# Patient Record
Sex: Female | Born: 1990 | Race: White | Hispanic: No | Marital: Married | Smoking: Never smoker
Health system: Southern US, Community
[De-identification: ages and names within clinical notes are randomized; demographics above are authoritative.]

## PROBLEM LIST (undated history)

## (undated) DIAGNOSIS — F419 Anxiety disorder, unspecified: Secondary | ICD-10-CM

## (undated) DIAGNOSIS — K219 Gastro-esophageal reflux disease without esophagitis: Secondary | ICD-10-CM

## (undated) DIAGNOSIS — R011 Cardiac murmur, unspecified: Secondary | ICD-10-CM

## (undated) DIAGNOSIS — D649 Anemia, unspecified: Secondary | ICD-10-CM

## (undated) DIAGNOSIS — F411 Generalized anxiety disorder: Secondary | ICD-10-CM

## (undated) HISTORY — PX: WISDOM TOOTH EXTRACTION: SHX21

## (undated) HISTORY — DX: Anxiety disorder, unspecified: F41.9

---
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
## 2009-03-19 ENCOUNTER — Ambulatory Visit: Payer: Self-pay | Admitting: Gastroenterology

## 2009-03-19 DIAGNOSIS — R197 Diarrhea, unspecified: Secondary | ICD-10-CM | POA: Insufficient documentation

## 2009-03-19 DIAGNOSIS — K920 Hematemesis: Secondary | ICD-10-CM | POA: Insufficient documentation

## 2009-03-19 DIAGNOSIS — R12 Heartburn: Secondary | ICD-10-CM | POA: Insufficient documentation

## 2009-03-20 ENCOUNTER — Telehealth (INDEPENDENT_AMBULATORY_CARE_PROVIDER_SITE_OTHER): Payer: Self-pay | Admitting: *Deleted

## 2009-03-20 LAB — CONVERTED CEMR LAB
ALT: 16 units/L (ref 0–35)
AST: 18 units/L (ref 0–37)
Albumin: 4.5 g/dL (ref 3.5–5.2)
Alkaline Phosphatase: 74 units/L (ref 39–117)
BUN: 14 mg/dL (ref 6–23)
Basophils Absolute: 0 10*3/uL (ref 0.0–0.1)
Basophils Relative: 0.2 % (ref 0.0–3.0)
Calcium: 9.7 mg/dL (ref 8.4–10.5)
Chloride: 103 meq/L (ref 96–112)
Eosinophils Absolute: 0.1 10*3/uL (ref 0.0–0.7)
IgA: 229 mg/dL (ref 68–378)
Lymphocytes Relative: 18.6 % (ref 12.0–46.0)
MCHC: 35.1 g/dL (ref 30.0–36.0)
MCV: 95.7 fL (ref 78.0–100.0)
Monocytes Absolute: 0.4 10*3/uL (ref 0.1–1.0)
Neutrophils Relative %: 75.9 % (ref 43.0–77.0)
Platelets: 269 10*3/uL (ref 150.0–400.0)
Potassium: 4 meq/L (ref 3.5–5.1)
RDW: 12.6 % (ref 11.5–14.6)
TSH: 1.48 microintl units/mL (ref 0.35–5.50)

## 2009-03-22 ENCOUNTER — Encounter: Payer: Self-pay | Admitting: Gastroenterology

## 2009-03-22 ENCOUNTER — Ambulatory Visit: Payer: Self-pay | Admitting: Gastroenterology

## 2009-04-03 ENCOUNTER — Encounter: Payer: Self-pay | Admitting: Gastroenterology

## 2009-09-04 IMAGING — RF DG UGI W/ HIGH DENSITY W/KUB
18 of 20 series · 18 of 20 positions shown · non-contrast
Comparison: None.

CLINICAL DATA: Abdominal pain with nausea and vomiting.

UPPER GI SERIES W/HIGH DENSITY W/KUB
TECHNIQUE: After obtaining a scout radiograph, upper GI series
performed with high density barium and effervescent agent. Thin
barium also used. Low dose pulsed fluoroscopy with  last image hold
imaging was utilized. Pelvic shield utilized.

[Series 1: run · 1 of 1 slices shown (1 of 17)]
[im 1/1]
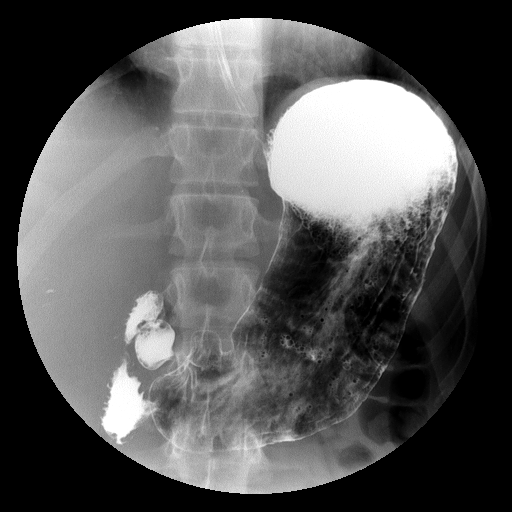

[Series 2: run · 1 of 1 slices shown (2 of 17)]
[im 1/1]
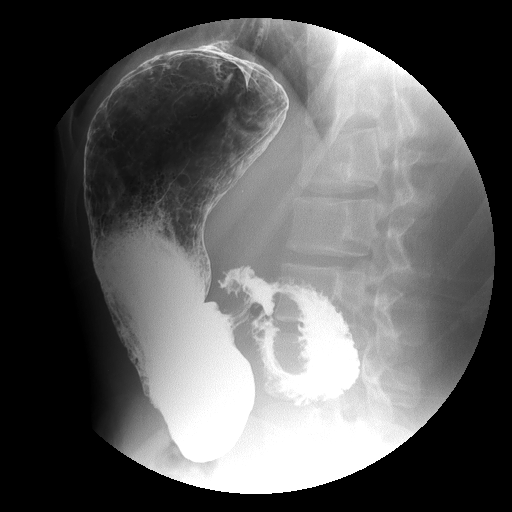

[Series 3: run · 1 of 1 slices shown (3 of 17)]
[im 1/1]
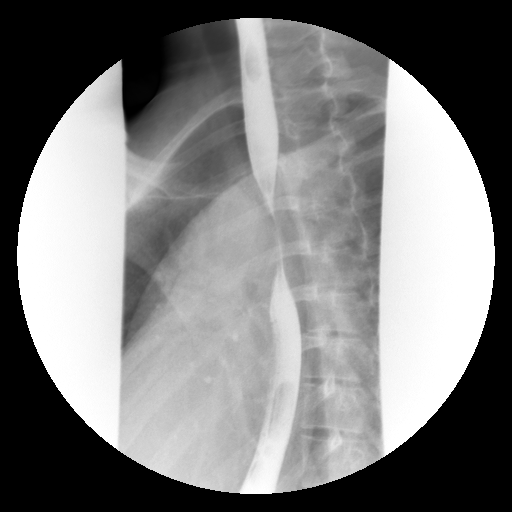

[Series 4: run · 1 of 1 slices shown (4 of 17)]
[im 1/1]
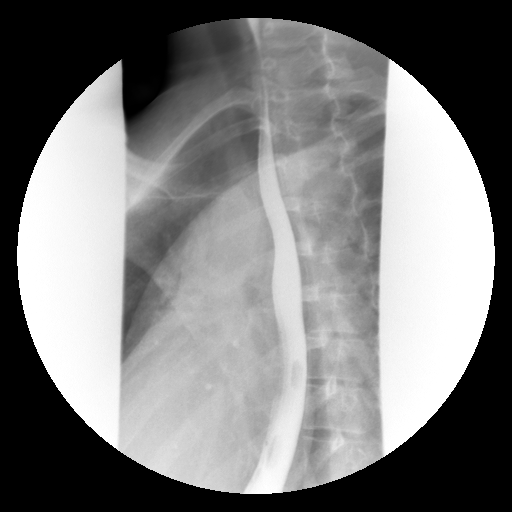

[Series 6: run · 1 of 1 slices shown (5 of 17)]
[im 1/1]
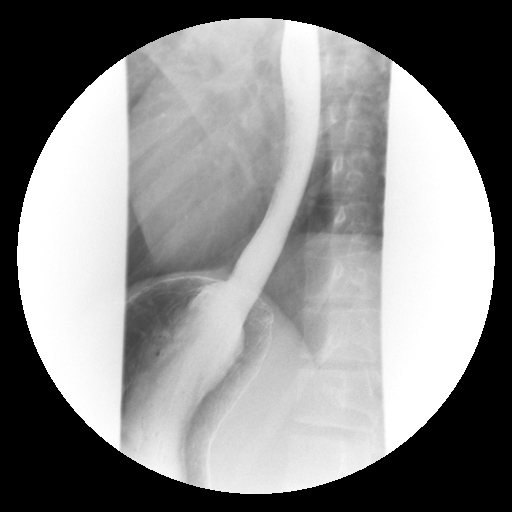

[Series 7: run · 1 of 1 slices shown (6 of 17)]
[im 1/1]
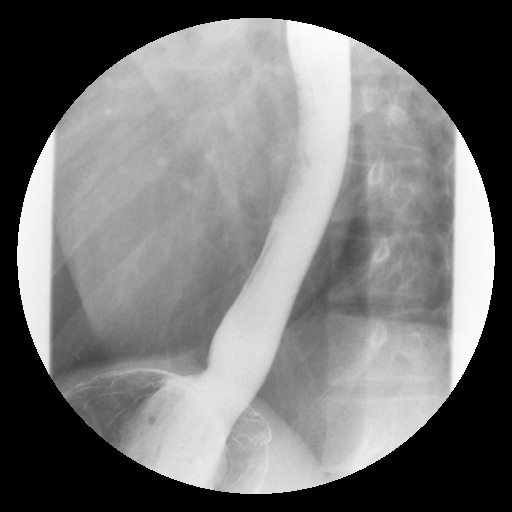

[Series 8: run · 1 of 1 slices shown (7 of 17)]
[im 1/1]
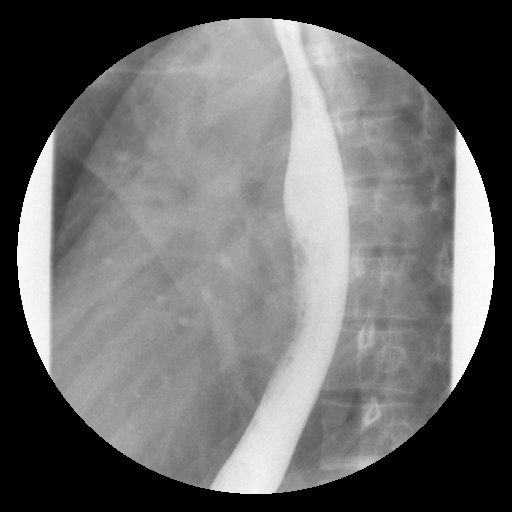

[Series 9: run · 1 of 1 slices shown (8 of 17)]
[im 1/1]
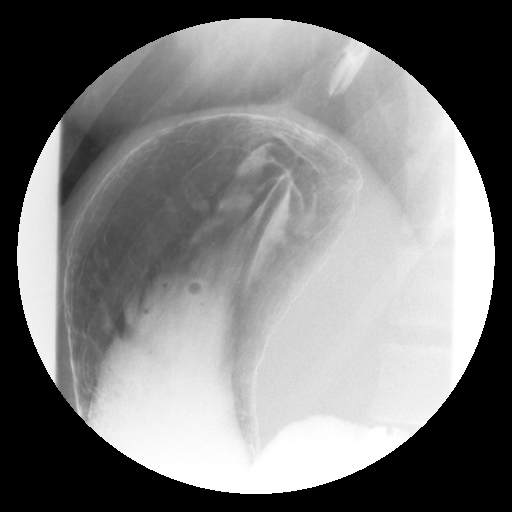

[Series 10: run · 1 of 1 slices shown (9 of 17)]
[im 1/1]
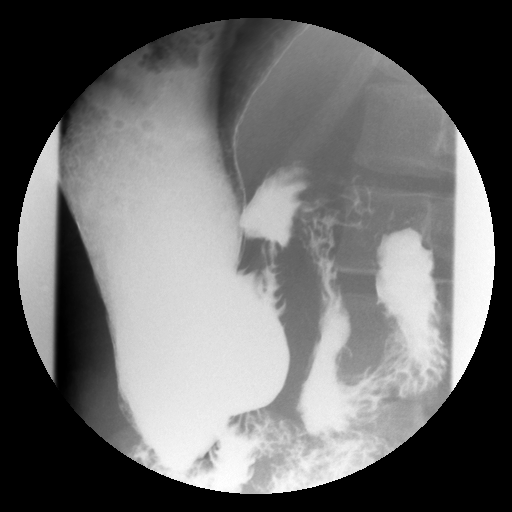

[Series 11: run · 1 of 1 slices shown (10 of 17)]
[im 1/1]
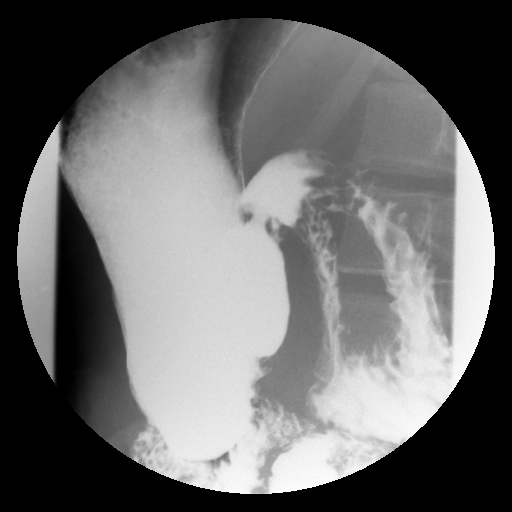

[Series 12: run · 1 of 1 slices shown (11 of 17)]
[im 1/1]
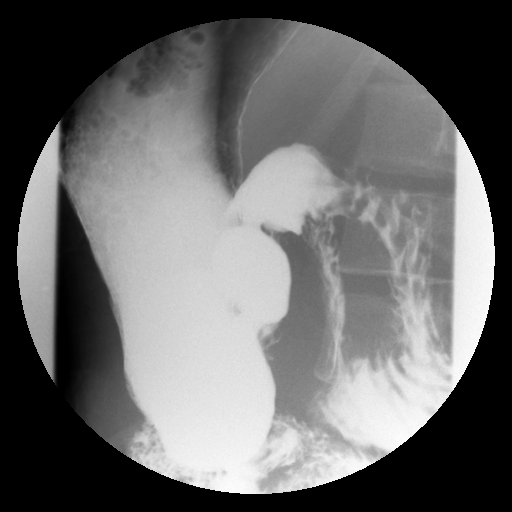

[Series 13: run · 1 of 1 slices shown (12 of 17)]
[im 1/1]
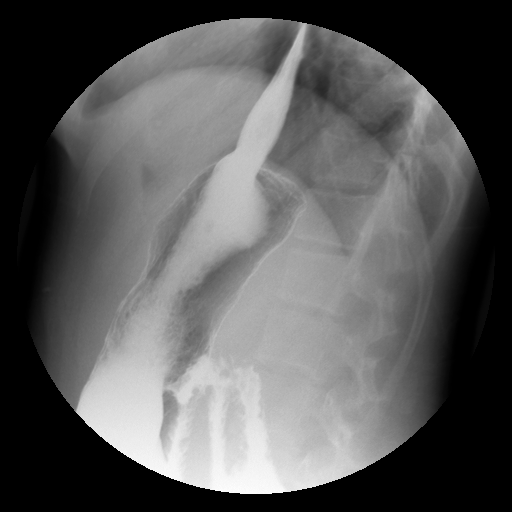

[Series 14: run · 1 of 1 slices shown (13 of 17)]
[im 1/1]
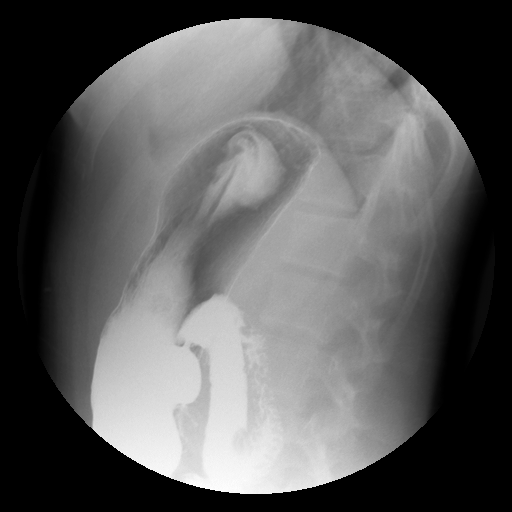

[Series 16: run · 1 of 1 slices shown (14 of 17)]
[im 1/1]
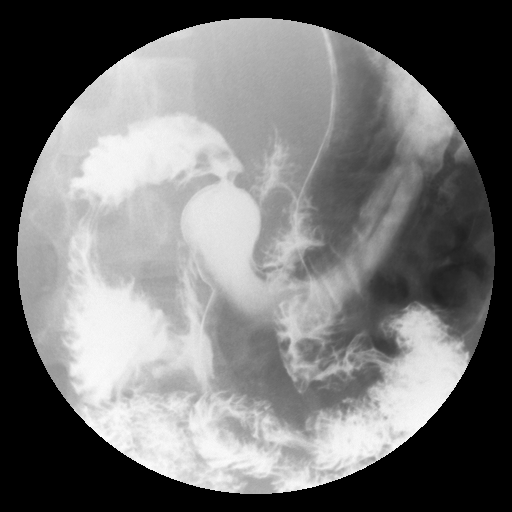

[Series 17: run · 1 of 1 slices shown (15 of 17)]
[im 1/1]
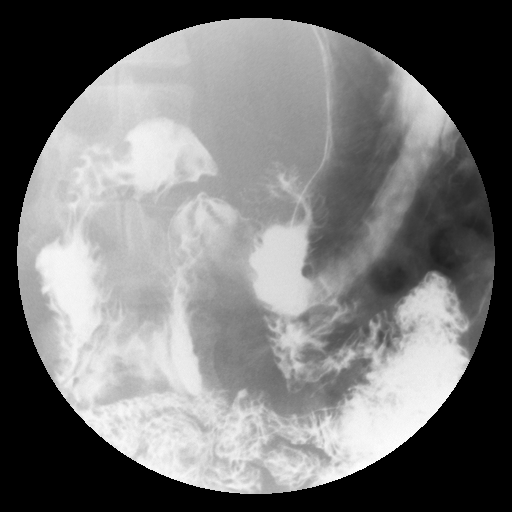

[Series 18: run · 1 of 1 slices shown (16 of 17)]
[im 1/1]
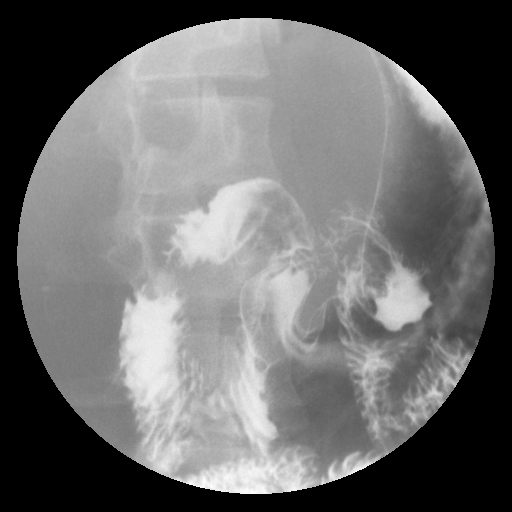

[Series 19: run · 1 of 1 slices shown (17 of 17)]
[im 1/1]
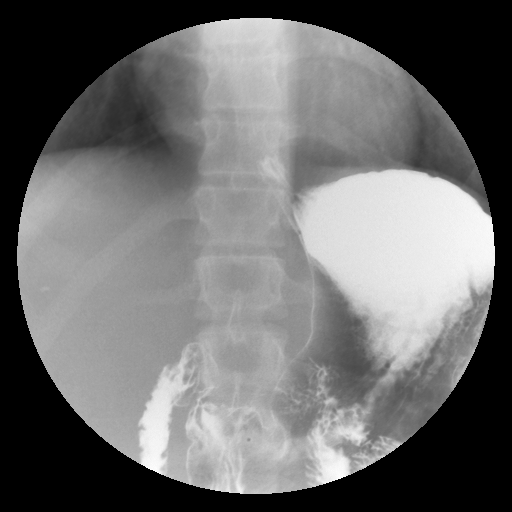

[Series 1001: view not recorded · 0.20mm/px · 1 of 1 slices shown]
[im 1/1]
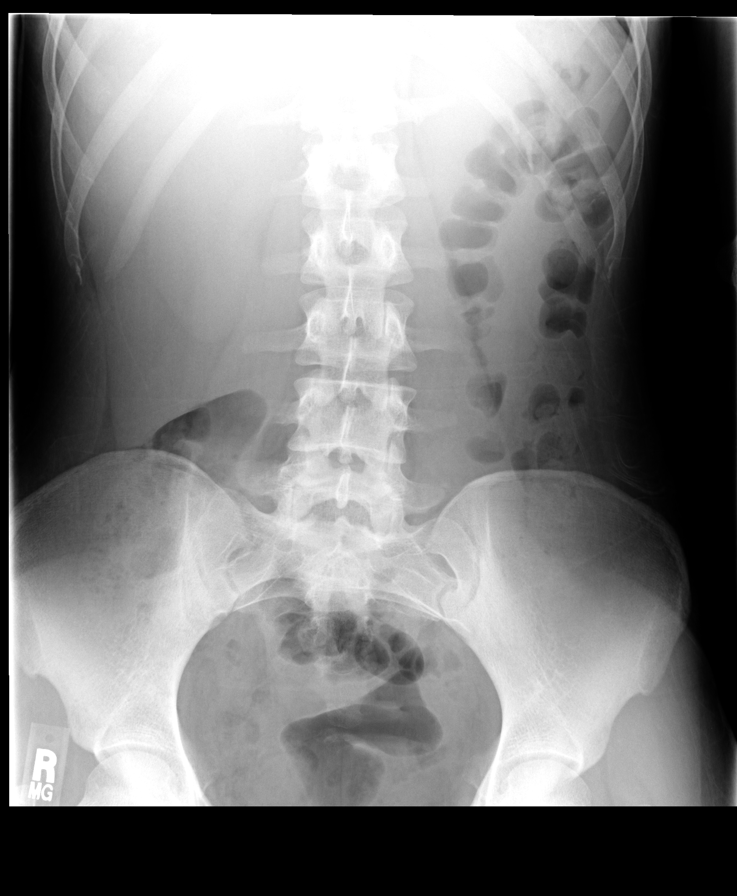

[18 of 20 positions shown; findings below may reference images not displayed]

FINDINGS: Scout view reveals a very minimal levoscoliosis of the
lumbar spine which may be positional.

Normal primary esophageal stripping wave without evidence of an
esophageal obstructing or constricting lesion.  Mild spontaneous
gastroesophageal reflux occurred into the distal thoracic esophagus
with change in patient position.  This immediately cleared.

Stomach is of normal contour without focal mucosal abnormality.
Duodenal bulb is of normal configuration without evidence of
ulceration.  Proximally visualized small bowel is unremarkable.
IMPRESSION: Mild spontaneous gastroesophageal reflux.  Otherwise negative exam.

## 2015-08-14 ENCOUNTER — Encounter: Payer: Self-pay | Admitting: Gastroenterology

## 2020-04-12 DIAGNOSIS — Z3201 Encounter for pregnancy test, result positive: Secondary | ICD-10-CM | POA: Diagnosis not present

## 2020-04-29 DIAGNOSIS — Z113 Encounter for screening for infections with a predominantly sexual mode of transmission: Secondary | ICD-10-CM | POA: Diagnosis not present

## 2020-04-29 DIAGNOSIS — Z01419 Encounter for gynecological examination (general) (routine) without abnormal findings: Secondary | ICD-10-CM | POA: Diagnosis not present

## 2020-04-29 DIAGNOSIS — Z124 Encounter for screening for malignant neoplasm of cervix: Secondary | ICD-10-CM | POA: Diagnosis not present

## 2020-04-29 DIAGNOSIS — Z1151 Encounter for screening for human papillomavirus (HPV): Secondary | ICD-10-CM | POA: Diagnosis not present

## 2020-04-29 DIAGNOSIS — B373 Candidiasis of vulva and vagina: Secondary | ICD-10-CM | POA: Diagnosis not present

## 2020-04-29 DIAGNOSIS — Z3A10 10 weeks gestation of pregnancy: Secondary | ICD-10-CM | POA: Diagnosis not present

## 2020-04-29 DIAGNOSIS — Z36 Encounter for antenatal screening for chromosomal anomalies: Secondary | ICD-10-CM | POA: Diagnosis not present

## 2020-04-29 DIAGNOSIS — O30049 Twin pregnancy, dichorionic/diamniotic, unspecified trimester: Secondary | ICD-10-CM | POA: Diagnosis not present

## 2020-05-06 ENCOUNTER — Inpatient Hospital Stay (HOSPITAL_COMMUNITY): Admit: 2020-05-06 | Payer: Self-pay | Admitting: Obstetrics and Gynecology

## 2020-05-27 DIAGNOSIS — Z3A14 14 weeks gestation of pregnancy: Secondary | ICD-10-CM | POA: Diagnosis not present

## 2020-05-27 DIAGNOSIS — O30041 Twin pregnancy, dichorionic/diamniotic, first trimester: Secondary | ICD-10-CM | POA: Diagnosis not present

## 2020-06-26 DIAGNOSIS — Z3A19 19 weeks gestation of pregnancy: Secondary | ICD-10-CM | POA: Diagnosis not present

## 2020-06-26 DIAGNOSIS — Z363 Encounter for antenatal screening for malformations: Secondary | ICD-10-CM | POA: Diagnosis not present

## 2020-06-26 DIAGNOSIS — O30042 Twin pregnancy, dichorionic/diamniotic, second trimester: Secondary | ICD-10-CM | POA: Diagnosis not present

## 2020-06-26 DIAGNOSIS — B373 Candidiasis of vulva and vagina: Secondary | ICD-10-CM | POA: Diagnosis not present

## 2020-06-26 DIAGNOSIS — Z361 Encounter for antenatal screening for raised alphafetoprotein level: Secondary | ICD-10-CM | POA: Diagnosis not present

## 2020-07-11 DIAGNOSIS — Z23 Encounter for immunization: Secondary | ICD-10-CM | POA: Diagnosis not present

## 2020-07-11 DIAGNOSIS — O30049 Twin pregnancy, dichorionic/diamniotic, unspecified trimester: Secondary | ICD-10-CM | POA: Diagnosis not present

## 2020-08-07 DIAGNOSIS — Z3689 Encounter for other specified antenatal screening: Secondary | ICD-10-CM | POA: Diagnosis not present

## 2020-08-28 DIAGNOSIS — Z3A28 28 weeks gestation of pregnancy: Secondary | ICD-10-CM | POA: Diagnosis not present

## 2020-08-28 DIAGNOSIS — Z23 Encounter for immunization: Secondary | ICD-10-CM | POA: Diagnosis not present

## 2020-08-28 DIAGNOSIS — Z3689 Encounter for other specified antenatal screening: Secondary | ICD-10-CM | POA: Diagnosis not present

## 2020-08-28 DIAGNOSIS — O30042 Twin pregnancy, dichorionic/diamniotic, second trimester: Secondary | ICD-10-CM | POA: Diagnosis not present

## 2020-09-27 DIAGNOSIS — O99013 Anemia complicating pregnancy, third trimester: Secondary | ICD-10-CM | POA: Diagnosis not present

## 2020-09-27 DIAGNOSIS — Z3A32 32 weeks gestation of pregnancy: Secondary | ICD-10-CM | POA: Diagnosis not present

## 2020-10-01 ENCOUNTER — Telehealth: Payer: Self-pay

## 2020-10-01 ENCOUNTER — Other Ambulatory Visit: Payer: Self-pay | Admitting: Obstetrics and Gynecology

## 2020-10-01 DIAGNOSIS — O30003 Twin pregnancy, unspecified number of placenta and unspecified number of amniotic sacs, third trimester: Secondary | ICD-10-CM

## 2020-10-01 NOTE — Telephone Encounter (Signed)
Received a referral for this patient to be scheduled STAT with our clinic. We had an opening on Friday and I went ahead and scheduled it for her. I left the patient a voicemail for her to call back and confirm.

## 2020-10-02 ENCOUNTER — Other Ambulatory Visit: Payer: Self-pay

## 2020-10-02 ENCOUNTER — Encounter (HOSPITAL_COMMUNITY): Payer: Self-pay | Admitting: Obstetrics and Gynecology

## 2020-10-02 ENCOUNTER — Observation Stay (HOSPITAL_COMMUNITY)
Admission: AD | Admit: 2020-10-02 | Discharge: 2020-10-04 | Disposition: A | Discharge status: Home / Self Care | Payer: BC Managed Care – PPO | Source: Home / Self Care | Attending: Obstetrics and Gynecology | Admitting: Obstetrics and Gynecology

## 2020-10-02 DIAGNOSIS — R030 Elevated blood-pressure reading, without diagnosis of hypertension: Secondary | ICD-10-CM | POA: Diagnosis not present

## 2020-10-02 DIAGNOSIS — M5489 Other dorsalgia: Secondary | ICD-10-CM | POA: Diagnosis not present

## 2020-10-02 DIAGNOSIS — O133 Gestational [pregnancy-induced] hypertension without significant proteinuria, third trimester: Secondary | ICD-10-CM | POA: Diagnosis not present

## 2020-10-02 DIAGNOSIS — O169 Unspecified maternal hypertension, unspecified trimester: Secondary | ICD-10-CM | POA: Diagnosis present

## 2020-10-02 DIAGNOSIS — O30043 Twin pregnancy, dichorionic/diamniotic, third trimester: Secondary | ICD-10-CM | POA: Diagnosis not present

## 2020-10-02 DIAGNOSIS — O163 Unspecified maternal hypertension, third trimester: Secondary | ICD-10-CM | POA: Diagnosis present

## 2020-10-02 DIAGNOSIS — O30009 Twin pregnancy, unspecified number of placenta and unspecified number of amniotic sacs, unspecified trimester: Secondary | ICD-10-CM | POA: Diagnosis present

## 2020-10-02 DIAGNOSIS — Z3A33 33 weeks gestation of pregnancy: Secondary | ICD-10-CM | POA: Diagnosis not present

## 2020-10-02 DIAGNOSIS — O365939 Maternal care for other known or suspected poor fetal growth, third trimester, other fetus: Secondary | ICD-10-CM | POA: Diagnosis present

## 2020-10-02 LAB — TYPE AND SCREEN
ABO/RH(D): B POS
Antibody Screen: NEGATIVE

## 2020-10-02 LAB — URINALYSIS, ROUTINE W REFLEX MICROSCOPIC
Bilirubin Urine: NEGATIVE
Glucose, UA: NEGATIVE mg/dL
Hgb urine dipstick: NEGATIVE
Ketones, ur: NEGATIVE mg/dL
Nitrite: NEGATIVE
Protein, ur: 100 mg/dL — AB
Specific Gravity, Urine: 1.018 (ref 1.005–1.030)
pH: 5 (ref 5.0–8.0)

## 2020-10-02 LAB — COMPREHENSIVE METABOLIC PANEL
ALT: 37 U/L (ref 0–44)
AST: 28 U/L (ref 15–41)
Albumin: 2.7 g/dL — ABNORMAL LOW (ref 3.5–5.0)
Alkaline Phosphatase: 162 U/L — ABNORMAL HIGH (ref 38–126)
Anion gap: 9 (ref 5–15)
BUN: 9 mg/dL (ref 6–20)
CO2: 21 mmol/L — ABNORMAL LOW (ref 22–32)
Calcium: 8.9 mg/dL (ref 8.9–10.3)
Chloride: 106 mmol/L (ref 98–111)
Creatinine, Ser: 0.71 mg/dL (ref 0.44–1.00)
GFR, Estimated: >60 mL/min (ref >60)
Glucose, Bld: 94 mg/dL (ref 70–99)
Potassium: 4.3 mmol/L (ref 3.5–5.1)
Sodium: 136 mmol/L (ref 135–145)
Total Bilirubin: 0.3 mg/dL (ref 0.3–1.2)
Total Protein: 6 g/dL — ABNORMAL LOW (ref 6.5–8.1)

## 2020-10-02 LAB — CBC
HCT: 30.8 % — ABNORMAL LOW (ref 36.0–46.0)
Hemoglobin: 10.5 g/dL — ABNORMAL LOW (ref 12.0–15.0)
MCH: 33.1 pg (ref 26.0–34.0)
MCHC: 34.1 g/dL (ref 30.0–36.0)
MCV: 97.2 fL (ref 80.0–100.0)
Platelets: 138 10*3/uL — ABNORMAL LOW (ref 150–400)
RBC: 3.17 MIL/uL — ABNORMAL LOW (ref 3.87–5.11)
RDW: 13.2 % (ref 11.5–15.5)
WBC: 7.1 10*3/uL (ref 4.0–10.5)
nRBC: 0 % (ref 0.0–0.2)

## 2020-10-02 LAB — RESP PANEL BY RT-PCR (FLU A&B, COVID) ARPGX2
Influenza A by PCR: NEGATIVE
Influenza B by PCR: NEGATIVE
SARS Coronavirus 2 by RT PCR: NEGATIVE

## 2020-10-02 LAB — PROTEIN / CREATININE RATIO, URINE
Creatinine, Urine: 254.34 mg/dL
Protein Creatinine Ratio: 0.27 mg/mg{Cre} — ABNORMAL HIGH (ref 0.00–0.15)
Total Protein, Urine: 69 mg/dL

## 2020-10-02 MED ORDER — ZOLPIDEM TARTRATE 5 MG PO TABS: 5.0000 mg | ORAL_TABLET | Freq: Every evening | ORAL | Status: DC | PRN

## 2020-10-02 MED ORDER — ACETAMINOPHEN 325 MG PO TABS
650.0000 mg | ORAL_TABLET | ORAL | Status: DC | PRN
  Administered 2020-10-02: 650 mg via ORAL
  Filled 2020-10-02: qty 2

## 2020-10-02 MED ORDER — DOCUSATE SODIUM 100 MG PO CAPS
100.0000 mg | ORAL_CAPSULE | Freq: Every day | ORAL | Status: DC
  Administered 2020-10-03: 100 mg via ORAL
  Filled 2020-10-02 (×2): qty 1

## 2020-10-02 MED ORDER — BETAMETHASONE SOD PHOS & ACET 6 (3-3) MG/ML IJ SUSP
12.0000 mg | INTRAMUSCULAR | Status: AC
  Administered 2020-10-02 – 2020-10-03 (×2): 12 mg via INTRAMUSCULAR
  Filled 2020-10-02 (×2): qty 5

## 2020-10-02 MED ORDER — CALCIUM CARBONATE ANTACID 500 MG PO CHEW
2.0000 | CHEWABLE_TABLET | ORAL | Status: DC | PRN
  Administered 2020-10-03: 400 mg via ORAL
  Filled 2020-10-02: qty 2

## 2020-10-02 MED ORDER — PRENATAL MULTIVITAMIN CH
1.0000 | ORAL_TABLET | Freq: Every day | ORAL | Status: DC
  Administered 2020-10-03: 1 via ORAL
  Filled 2020-10-02: qty 1

## 2020-10-02 NOTE — MAU Note (Signed)
Desiree Miller is a 30 y.o. at [redacted]w[redacted]d here in MAU reporting: was sent over from the office for elevated BP. Since yesterday has had a headache and increased swelling in legs. Took tylenol yesterday for headache and it did help but has not taken any today. No visual changes  Onset of complaint: yesterday  Pain score: 3/10  Vitals:   10/02/20 1327  BP: 137/90  Pulse: 90  Resp: 16  Temp: 98.2 F (36.8 C)  SpO2: 98%     FHT: +FM  Lab orders placed from triage: UA

## 2020-10-02 NOTE — H&P (Signed)
Desiree Miller is a 30 y.o. female G2P0010 [redacted]w[redacted]d with DCDA twin pregnancy presenting for elevated blood pressures. Patient has no history of HTN predating the pregnancy and has been normotensive throughout the pregnancy until today. Patient seen in office for routine visit and was found to have BP 137/105 and 142/108 and trace protein on UA. Patient has been followed with serial growth [place] for DCDA twin pregnancy. Most recent scan in office on 2/25 showed growth discordance of 3% with baby A breech EFW 12% (3#3) with AC 8% ant placenta and normal fluuid, Baby B vertex EFW 16% (3#15) with AC 48% ant placenta and normal fluid. US performed in office today showed BPP 8/8 for both Baby A and Baby B with normal UA Dopplers x2. NST reactive for both babies in office as well. However given risk factors for preeclampsia patient was sent for MAU for labs and additional BP monitoring.   Spontaneous twin pregnancy dated by LMP c/w 8w sono and routine PNC at [facility]. History of TOP x1. Patient has history of anxiety and has been on Zoloft throughout. Has had some acid reflux managed with Pepcid BID. Otherwise uncomplicated. Panorama NIPS low risk x2, AFP negative screen, and normal anatomy scans. Third trimester labs significant for mild thrombocytopenia with platelets of 125 on 09/27/20  (initially 220 at start of pregnancy) and anemia with Hgb 10.5, started on PO iron. 1hr GTT WNL at 120.   Today patient does endorse HA this morning, frontal and bilateral, resolved with Tylenol. Denies any associated vision changes. Denies any CP or SOB. Reports some epigastric pain but difficult to distinguish from fetal movement pain and acid reflux. Some intermittent episodes of N/V over the past few weeks. Increased swelling in hands and feet. Denies any CTXs, VB, or LOF. Endorses good FM from both babies.    OB History    Gravida  2   Para      Term      Preterm      AB  1   Living  0     SAB      IAB  1   Ectopic       Multiple      Live Births             Past Medical History:  Diagnosis Date  . Anxiety    Past Surgical History:  Procedure Laterality Date  . WISDOM TOOTH EXTRACTION     Family History: family history is not on file. Social History:  reports that she has never smoked. She has never used smokeless tobacco. She reports previous alcohol use. She reports that she does not use drugs.     Maternal Diabetes: No Genetic Screening: Normal Maternal Ultrasounds/Referrals: Normal Fetal Ultrasounds or other Referrals:  None Maternal Substance Abuse:  No Significant Maternal Medications:  Meds include: Zoloft Significant Maternal Lab Results:  None Other Comments:  None  Review of Systems  All other systems reviewed and are negative.  Per HPI Exam Physical Exam    Blood pressure (!) 148/98, pulse 83, temperature 98.4 F (36.9 C), temperature source Oral, resp. rate 16, height 5' 5.5" (1.664 m), weight 84.8 kg, last menstrual period 02/13/2020, SpO2 99 %.  General: AAO, NAD Heart: RRR no audible murmurs Lungs: CTABL no rales no crackles Abdomen: gravid uterus, non-tender to palpation Ext: trace b/l LE edema, neg Homan Neuro: +1 DTR's b/l UE, +3 DTR's b/l LE, no clonus  Fetal Monitoring:  -Baby A: cat I baseline  130 bpm mod var +accels,-decels  -Baby B: cat I baseline 135 bpm mod var +accels, -decels  Toco: rare ctx, some intermittent uterine irritability  Prenatal labs: ABO, Rh:  --/--/B POS (03/02 1605) Antibody: NEG (03/02 1605) Rubella:  Immune RPR:   NR HBsAg:   NEG HIV:   NR GBS:   n/a  Chemistry Recent Labs  Lab 10/02/20 1358  NA 136  K 4.3  CL 106  CO2 21*  GLUCOSE 94  BUN 9  CREATININE 0.71  CALCIUM 8.9  GFRNONAA >60  ANIONGAP 9    Recent Labs  Lab 10/02/20 1358  PROT 6.0*  ALBUMIN 2.7*  AST 28  ALT 37  ALKPHOS 162*  BILITOT 0.3   Hematology Recent Labs  Lab 10/02/20 1358  WBC 7.1  RBC 3.17*  HGB 10.5*  HCT 30.8*  MCV 97.2   MCH 33.1  MCHC 34.1  RDW 13.2  PLT 138*    Assessment/Plan: Desiree Miller is a 30 y.o. female G2P0010 DCDA twin pregnancy at [redacted]w[redacted]d admitted with new diagnosis of gestational hypertension for observation and steroids. Findings explained to patient and recommendation for plan of care as follows:   1. gHTN -Review of BP's from office and MAU monitoring, patient does meet criteria for gHTN. No severe ranges. PIH labs significant for mild thrombocytopenia with platelets of 138 -Given DCDA twin pregnancy with new finding of growth restriction in Baby A by [facility] <10% criteria, patient is at increased risk for developing preeclampsia and warrants admission for observation -Repeat PIH labs in AM. 24hr urine collection for protein started -BMZ for fetal lung maturity, first dose given at 1613 today, plan for 2nd dose tmw 3/3 @ 1600 -Continue close BP monitoring, consider starting PO antihypertensives if severe ranges, IV protocol as needed 2. DCDA growth restriction baby A by [facility] measurement of 8% -Reassuring fetal monitoring x2 including normal UA Dopplers x2 in office today and BPP 8/8 -NST monitoring TID  3. Anxiety  -cont home Zoloft dosing -Ambien PRN sleep 4. Acid Reflux -cont home Pepcid dosing BID -antiemetics PRN 5. SCD VTE ppx while in bed, ambulatory 6. Routine antepartum care.  7. Disposition: continue inpatient observation overnight, discharge pending BP's and AM labs  Rosalynn Sergent A Zareen Jamison 10/02/2020, 8:27 PM

## 2020-10-03 DIAGNOSIS — Z3A33 33 weeks gestation of pregnancy: Secondary | ICD-10-CM | POA: Diagnosis not present

## 2020-10-03 DIAGNOSIS — O133 Gestational [pregnancy-induced] hypertension without significant proteinuria, third trimester: Secondary | ICD-10-CM | POA: Diagnosis not present

## 2020-10-03 DIAGNOSIS — O30043 Twin pregnancy, dichorionic/diamniotic, third trimester: Secondary | ICD-10-CM | POA: Diagnosis not present

## 2020-10-03 LAB — CBC
HCT: 27.6 % — ABNORMAL LOW (ref 36.0–46.0)
Hemoglobin: 9.8 g/dL — ABNORMAL LOW (ref 12.0–15.0)
MCH: 34.8 pg — ABNORMAL HIGH (ref 26.0–34.0)
MCHC: 35.5 g/dL (ref 30.0–36.0)
MCV: 97.9 fL (ref 80.0–100.0)
Platelets: 132 10*3/uL — ABNORMAL LOW (ref 150–400)
RBC: 2.82 MIL/uL — ABNORMAL LOW (ref 3.87–5.11)
RDW: 13.3 % (ref 11.5–15.5)
WBC: 8.9 10*3/uL (ref 4.0–10.5)
nRBC: 0 % (ref 0.0–0.2)

## 2020-10-03 LAB — PROTEIN, URINE, 24 HOUR
Collection Interval-UPROT: 24 hours
Protein, 24H Urine: 480 mg/d — ABNORMAL HIGH (ref 50–100)
Protein, Urine: 48 mg/dL
Urine Total Volume-UPROT: 1000 mL

## 2020-10-03 LAB — COMPREHENSIVE METABOLIC PANEL
ALT: 29 U/L (ref 0–44)
AST: 23 U/L (ref 15–41)
Albumin: 2.4 g/dL — ABNORMAL LOW (ref 3.5–5.0)
Alkaline Phosphatase: 135 U/L — ABNORMAL HIGH (ref 38–126)
Anion gap: 10 (ref 5–15)
BUN: 10 mg/dL (ref 6–20)
CO2: 19 mmol/L — ABNORMAL LOW (ref 22–32)
Calcium: 8.7 mg/dL — ABNORMAL LOW (ref 8.9–10.3)
Chloride: 105 mmol/L (ref 98–111)
Creatinine, Ser: 0.69 mg/dL (ref 0.44–1.00)
GFR, Estimated: >60 mL/min (ref >60)
Glucose, Bld: 111 mg/dL — ABNORMAL HIGH (ref 70–99)
Potassium: 4.4 mmol/L (ref 3.5–5.1)
Sodium: 134 mmol/L — ABNORMAL LOW (ref 135–145)
Total Bilirubin: 0.4 mg/dL (ref 0.3–1.2)
Total Protein: 5.4 g/dL — ABNORMAL LOW (ref 6.5–8.1)

## 2020-10-03 LAB — CREATININE CLEARANCE, URINE, 24 HOUR
Collection Interval-CRCL: 24 hours
Creatinine Clearance: 111 mL/min (ref 75–115)
Creatinine, 24H Ur: 1099 mg/d (ref 600–1800)
Creatinine, Urine: 109.9 mg/dL
Urine Total Volume-CRCL: 1000 mL

## 2020-10-03 MED ORDER — LABETALOL HCL 100 MG PO TABS
100.0000 mg | ORAL_TABLET | Freq: Two times a day (BID) | ORAL | Status: DC
  Administered 2020-10-03 – 2020-10-04 (×3): 100 mg via ORAL
  Filled 2020-10-03 (×3): qty 1

## 2020-10-03 NOTE — Progress Notes (Signed)
Desiree Miller 30 y.o. G2P0010 at [redacted]w[redacted]d HD#2 admitted with DCDA twin pregnancy complicated by new onset elevated blood pressures, now meeting criteria for gHTN  S: Patient is doing well this morning. She did have some troubles sleeping due to nerves but eventually able to rest. She denies any HA or vision changes. Denies CP or SOB. Some abdominal discomfort related to FM but denies any RUQ pain, N/V. Denies CTXs, VB, or LOF. Endorses good movements from both babies.  O: Vitals:   10/02/20 1625 10/02/20 1941 10/02/20 2313 10/03/20 0402  BP:  (!) 148/98 (!) 151/91 (!) 149/89  Pulse:  83 77 72  Resp:   16 17  Temp:  98.4 F (36.9 C) 98.5 F (36.9 C) 98.4 F (36.9 C)  TempSrc:  Oral    SpO2: 99% 99% 99% 98%  Weight:      Height:       Exam:  General: AAO, NAD Heart: RRR  Lungs: CTABL no rales no crackles Abdomen: gravid uterus, non-tender Ext: trace b/l LE edema Neuro: DTR's +3-4 b/l LE  Fetal Monitoring:  -Baby A: reactive baseline 110 bpm mod var +accels, -decels -Baby B: reactive baseline 120 bpm mod var +accels, -decels  Toco: acontractile  CMP     Component Value Date/Time   NA 134 (L) 10/03/2020 0620   K 4.4 10/03/2020 0620   CL 105 10/03/2020 0620   CO2 19 (L) 10/03/2020 0620   GLUCOSE 111 (H) 10/03/2020 0620   BUN 10 10/03/2020 0620   CREATININE 0.69 10/03/2020 0620   CALCIUM 8.7 (L) 10/03/2020 0620   PROT 5.4 (L) 10/03/2020 0620   ALBUMIN 2.4 (L) 10/03/2020 0620   AST 23 10/03/2020 0620   ALT 29 10/03/2020 0620   ALKPHOS 135 (H) 10/03/2020 0620   BILITOT 0.4 10/03/2020 0620   GFRNONAA >60 10/03/2020 0620   CBC    Component Value Date/Time   WBC 8.9 10/03/2020 0620   RBC 2.82 (L) 10/03/2020 0620   HGB 9.8 (L) 10/03/2020 0620   HCT 27.6 (L) 10/03/2020 0620   PLT 132 (L) 10/03/2020 0620   MCV 97.9 10/03/2020 0620   MCH 34.8 (H) 10/03/2020 0620   MCHC 35.5 10/03/2020 0620   RDW 13.3 10/03/2020 0620   LYMPHSABS 1.7 03/19/2009 1406   MONOABS 0.4  03/19/2009 1406   EOSABS 0.1 03/19/2009 1406   BASOSABS 0.0 03/19/2009 1406     A/P: Desiree Miller 30 y.o. G2P0010 at [redacted]w[redacted]d HD#2 admitted with DCDA twin pregnancy with elevated BP, now meeting criteria for gHTN without evidence of preEclampsia  1. gHTN -Review of BP's over past 24hr labile ranging from 101/56- 151/91, no severe ranges. This morning normal at 137/88, low threshold to start antihypertensives if BP's become persistently >140/90 -Repeat PIH labs this morning stable, platelets still low at 132 but unchanged from previous --24hr urine collection for protein to complete this afternoon -BMZ for fetal lung maturity, s/p 1st dose 3/2 @ 1613, plan for 2nd dose @ 1600 today 2. DCDA growth restriction baby A by [facility] measurement of 8% -Reassuring fetal monitoring x2 including normal UA Dopplers x2  and BPP 8/8 on 3/2 in office -NST monitoring TID  3. Anxiety  -cont home Zoloft dosing -Ambien PRN sleep 4. Acid Reflux -cont home Pepcid dosing BID -antiemetics PRN 5. SCD VTE ppx while in bed, ambulatory 6. Routine antepartum care.  7. Disposition: continue inpatient observation today, discharge planning pending BP's  Desiree Miller 10/03/20 8:36 AM

## 2020-10-03 NOTE — Plan of Care (Signed)
  Problem: Education: Goal: Knowledge of General Education information will improve Description: Including pain rating scale, medication(s)/side effects and non-pharmacologic comfort measures Outcome: Progressing   Problem: Coping: Goal: Level of anxiety will decrease Outcome: Progressing   Problem: Pain Managment: Goal: General experience of comfort will improve Outcome: Progressing   Problem: Education: Goal: Knowledge of disease or condition will improve Outcome: Progressing Goal: Knowledge of the prescribed therapeutic regimen will improve Outcome: Progressing   Problem: Education: Goal: Knowledge of disease or condition will improve Outcome: Progressing Goal: Knowledge of the prescribed therapeutic regimen will improve Outcome: Progressing

## 2020-10-04 ENCOUNTER — Ambulatory Visit: Payer: Self-pay

## 2020-10-04 ENCOUNTER — Other Ambulatory Visit: Payer: Self-pay

## 2020-10-04 DIAGNOSIS — Z3A33 33 weeks gestation of pregnancy: Secondary | ICD-10-CM | POA: Diagnosis not present

## 2020-10-04 DIAGNOSIS — O30043 Twin pregnancy, dichorionic/diamniotic, third trimester: Secondary | ICD-10-CM | POA: Diagnosis not present

## 2020-10-04 DIAGNOSIS — O133 Gestational [pregnancy-induced] hypertension without significant proteinuria, third trimester: Secondary | ICD-10-CM | POA: Diagnosis not present

## 2020-10-04 MED ORDER — LABETALOL HCL 100 MG PO TABS: 100.0000 mg | ORAL_TABLET | Freq: Two times a day (BID) | ORAL | 3 refills | Status: DC

## 2020-10-04 NOTE — Progress Notes (Signed)
Discharge instructions given to patient.  Discussed signs and symptoms of hypertension. Medications reviewed.

## 2020-10-04 NOTE — Progress Notes (Signed)
Desiree Miller 30 y.o. G2P0010 at [redacted]w[redacted]d HD#1 admitted with DCDA twin pregnancy complicated by new onset elevated blood pressures, now meeting criteria for PEC without severe featurs  S: Patient is doing well this morning. She did have some troubles sleeping due to nerves but eventually able to rest. She denies any HA or vision changes. Denies CP or SOB. Some abdominal discomfort related to FM but denies any RUQ pain, N/V. Denies CTXs, VB, or LOF. Endorses good movements from both babies.  O: Vitals:   10/03/20 2022 10/04/20 0028 10/04/20 0639 10/04/20 0700  BP: (!) 159/93 (!) 143/94 (!) 145/87 (!) 143/91  Pulse: 84 70 70 67  Resp: 18 18 18 19   Temp: 99.1 F (37.3 C) 99 F (37.2 C) 98.5 F (36.9 C) 98.1 F (36.7 C)  TempSrc: Oral Oral Oral Oral  SpO2: 98% 97% 99% 98%  Weight:      Height:       Exam:  General: AAO, NAD Heart: RRR  Lungs: CTABL no rales no crackles Abdomen: gravid uterus, non-tender Ext: trace b/l LE edema Neuro: DTR's +3-4 b/l LE sKin : intact VE: deferred  Fetal Monitoring:  -Baby A: reactive baseline 110 bpm mod var +accels, -decels -Baby B: reactive baseline 120 bpm mod var +accels, -decels  Toco: acontractile  CMP     Component Value Date/Time   NA 134 (L) 10/03/2020 0620   K 4.4 10/03/2020 0620   CL 105 10/03/2020 0620   CO2 19 (L) 10/03/2020 0620   GLUCOSE 111 (H) 10/03/2020 0620   BUN 10 10/03/2020 0620   CREATININE 0.69 10/03/2020 0620   CALCIUM 8.7 (L) 10/03/2020 0620   PROT 5.4 (L) 10/03/2020 0620   ALBUMIN 2.4 (L) 10/03/2020 0620   AST 23 10/03/2020 0620   ALT 29 10/03/2020 0620   ALKPHOS 135 (H) 10/03/2020 0620   BILITOT 0.4 10/03/2020 0620   GFRNONAA >60 10/03/2020 0620   CBC    Component Value Date/Time   WBC 8.9 10/03/2020 0620   RBC 2.82 (L) 10/03/2020 0620   HGB 9.8 (L) 10/03/2020 0620   HCT 27.6 (L) 10/03/2020 0620   PLT 132 (L) 10/03/2020 0620   MCV 97.9 10/03/2020 0620   MCH 34.8 (H) 10/03/2020 0620   MCHC 35.5  10/03/2020 0620   RDW 13.3 10/03/2020 0620   LYMPHSABS 1.7 03/19/2009 1406   MONOABS 0.4 03/19/2009 1406   EOSABS 0.1 03/19/2009 1406   BASOSABS 0.0 03/19/2009 1406     A/P: Desiree Miller 30 y.o. G2P0010 at [redacted]w[redacted]d HD#3 admitted with DCDA twin pregnancy with elevated BP, now meeting criteria for PEC w/o severe features  1. PEC w/o severe features Labs stable. Bp stable on Labetalol 2. DCDA growth restriction baby A by [facility] measurement of 8% -Reassuring fetal monitoring x2 including normal UA Dopplers x2  and BPP 8/8 on 3/2 in office 3. Anxiety  -cont home Zoloft dosing -Ambien PRN sleep 4. Acid Reflux -cont home Pepcid dosing BID -antiemetics PRN dC home Fu office 2x per week PEC precautions  TAAVON,RICHARD J 10/04/20 9:16 AM

## 2020-10-05 ENCOUNTER — Inpatient Hospital Stay (HOSPITAL_COMMUNITY): Payer: BC Managed Care – PPO | Admitting: Anesthesiology

## 2020-10-05 ENCOUNTER — Other Ambulatory Visit: Payer: Self-pay

## 2020-10-05 ENCOUNTER — Encounter (HOSPITAL_COMMUNITY)
Admission: AD | Disposition: A | Discharge status: Home / Self Care | Payer: Self-pay | Source: Home / Self Care | Attending: Obstetrics & Gynecology

## 2020-10-05 ENCOUNTER — Inpatient Hospital Stay (HOSPITAL_COMMUNITY)
Admission: AD | Admit: 2020-10-05 | Discharge: 2020-10-10 | DRG: 787 | Disposition: A | Discharge status: Home / Self Care | Payer: BC Managed Care – PPO | Attending: Obstetrics & Gynecology | Admitting: Obstetrics & Gynecology

## 2020-10-05 DIAGNOSIS — D62 Acute posthemorrhagic anemia: Secondary | ICD-10-CM | POA: Diagnosis not present

## 2020-10-05 DIAGNOSIS — Z20822 Contact with and (suspected) exposure to covid-19: Secondary | ICD-10-CM | POA: Diagnosis present

## 2020-10-05 DIAGNOSIS — O30049 Twin pregnancy, dichorionic/diamniotic, unspecified trimester: Secondary | ICD-10-CM | POA: Diagnosis present

## 2020-10-05 DIAGNOSIS — O365939 Maternal care for other known or suspected poor fetal growth, third trimester, other fetus: Secondary | ICD-10-CM | POA: Diagnosis not present

## 2020-10-05 DIAGNOSIS — D696 Thrombocytopenia, unspecified: Secondary | ICD-10-CM | POA: Diagnosis not present

## 2020-10-05 DIAGNOSIS — R94120 Abnormal auditory function study: Secondary | ICD-10-CM | POA: Diagnosis not present

## 2020-10-05 DIAGNOSIS — R109 Unspecified abdominal pain: Secondary | ICD-10-CM | POA: Diagnosis present

## 2020-10-05 DIAGNOSIS — O322XX2 Maternal care for transverse and oblique lie, fetus 2: Secondary | ICD-10-CM | POA: Diagnosis not present

## 2020-10-05 DIAGNOSIS — O1413 Severe pre-eclampsia, third trimester: Secondary | ICD-10-CM | POA: Diagnosis present

## 2020-10-05 DIAGNOSIS — F419 Anxiety disorder, unspecified: Secondary | ICD-10-CM | POA: Diagnosis present

## 2020-10-05 DIAGNOSIS — R609 Edema, unspecified: Secondary | ICD-10-CM | POA: Diagnosis not present

## 2020-10-05 DIAGNOSIS — O321XX1 Maternal care for breech presentation, fetus 1: Secondary | ICD-10-CM | POA: Diagnosis not present

## 2020-10-05 DIAGNOSIS — O99344 Other mental disorders complicating childbirth: Secondary | ICD-10-CM | POA: Diagnosis not present

## 2020-10-05 DIAGNOSIS — Q5310 Unspecified undescended testicle, unilateral: Secondary | ICD-10-CM | POA: Diagnosis not present

## 2020-10-05 DIAGNOSIS — O30043 Twin pregnancy, dichorionic/diamniotic, third trimester: Secondary | ICD-10-CM | POA: Diagnosis not present

## 2020-10-05 DIAGNOSIS — D1801 Hemangioma of skin and subcutaneous tissue: Secondary | ICD-10-CM | POA: Diagnosis not present

## 2020-10-05 DIAGNOSIS — O9081 Anemia of the puerperium: Secondary | ICD-10-CM | POA: Diagnosis not present

## 2020-10-05 DIAGNOSIS — O9912 Other diseases of the blood and blood-forming organs and certain disorders involving the immune mechanism complicating childbirth: Secondary | ICD-10-CM | POA: Diagnosis present

## 2020-10-05 DIAGNOSIS — O9962 Diseases of the digestive system complicating childbirth: Secondary | ICD-10-CM | POA: Diagnosis not present

## 2020-10-05 DIAGNOSIS — F410 Panic disorder [episodic paroxysmal anxiety] without agoraphobia: Secondary | ICD-10-CM | POA: Diagnosis not present

## 2020-10-05 DIAGNOSIS — O1414 Severe pre-eclampsia complicating childbirth: Secondary | ICD-10-CM | POA: Diagnosis not present

## 2020-10-05 DIAGNOSIS — R309 Painful micturition, unspecified: Secondary | ICD-10-CM | POA: Diagnosis not present

## 2020-10-05 DIAGNOSIS — K219 Gastro-esophageal reflux disease without esophagitis: Secondary | ICD-10-CM | POA: Diagnosis present

## 2020-10-05 DIAGNOSIS — O411230 Chorioamnionitis, third trimester, not applicable or unspecified: Secondary | ICD-10-CM | POA: Diagnosis not present

## 2020-10-05 DIAGNOSIS — Q256 Stenosis of pulmonary artery: Secondary | ICD-10-CM | POA: Diagnosis not present

## 2020-10-05 DIAGNOSIS — Z3A33 33 weeks gestation of pregnancy: Secondary | ICD-10-CM | POA: Diagnosis not present

## 2020-10-05 DIAGNOSIS — N390 Urinary tract infection, site not specified: Secondary | ICD-10-CM | POA: Diagnosis not present

## 2020-10-05 DIAGNOSIS — Z880 Allergy status to penicillin: Secondary | ICD-10-CM | POA: Diagnosis not present

## 2020-10-05 DIAGNOSIS — Z23 Encounter for immunization: Secondary | ICD-10-CM | POA: Diagnosis not present

## 2020-10-05 DIAGNOSIS — Z98891 History of uterine scar from previous surgery: Secondary | ICD-10-CM

## 2020-10-05 DIAGNOSIS — O43893 Other placental disorders, third trimester: Secondary | ICD-10-CM | POA: Diagnosis not present

## 2020-10-05 DIAGNOSIS — O1415 Severe pre-eclampsia, complicating the puerperium: Secondary | ICD-10-CM | POA: Diagnosis not present

## 2020-10-05 LAB — URINALYSIS, ROUTINE W REFLEX MICROSCOPIC
Bilirubin Urine: NEGATIVE
Glucose, UA: NEGATIVE mg/dL
Hgb urine dipstick: NEGATIVE
Ketones, ur: NEGATIVE mg/dL
Nitrite: NEGATIVE
Protein, ur: 100 mg/dL — AB
Specific Gravity, Urine: 1.018 (ref 1.005–1.030)
pH: 6 (ref 5.0–8.0)

## 2020-10-05 LAB — CBC
HCT: 27.5 % — ABNORMAL LOW (ref 36.0–46.0)
Hemoglobin: 9.6 g/dL — ABNORMAL LOW (ref 12.0–15.0)
MCH: 34.4 pg — ABNORMAL HIGH (ref 26.0–34.0)
MCHC: 34.9 g/dL (ref 30.0–36.0)
MCV: 98.6 fL (ref 80.0–100.0)
Platelets: 165 10*3/uL (ref 150–400)
RBC: 2.79 MIL/uL — ABNORMAL LOW (ref 3.87–5.11)
RDW: 13.7 % (ref 11.5–15.5)
WBC: 11.9 10*3/uL — ABNORMAL HIGH (ref 4.0–10.5)
nRBC: 0.4 % — ABNORMAL HIGH (ref 0.0–0.2)

## 2020-10-05 LAB — COMPREHENSIVE METABOLIC PANEL
ALT: 28 U/L (ref 0–44)
AST: 30 U/L (ref 15–41)
Albumin: 2.7 g/dL — ABNORMAL LOW (ref 3.5–5.0)
Alkaline Phosphatase: 157 U/L — ABNORMAL HIGH (ref 38–126)
Anion gap: 9 (ref 5–15)
BUN: 9 mg/dL (ref 6–20)
CO2: 20 mmol/L — ABNORMAL LOW (ref 22–32)
Calcium: 9.1 mg/dL (ref 8.9–10.3)
Chloride: 106 mmol/L (ref 98–111)
Creatinine, Ser: 0.68 mg/dL (ref 0.44–1.00)
GFR, Estimated: >60 mL/min (ref >60)
Glucose, Bld: 73 mg/dL (ref 70–99)
Potassium: 4.1 mmol/L (ref 3.5–5.1)
Sodium: 135 mmol/L (ref 135–145)
Total Bilirubin: 0.8 mg/dL (ref 0.3–1.2)
Total Protein: 5.9 g/dL — ABNORMAL LOW (ref 6.5–8.1)

## 2020-10-05 LAB — PROTEIN / CREATININE RATIO, URINE
Creatinine, Urine: 153.56 mg/dL
Protein Creatinine Ratio: 1.36 mg/mg{Cre} — ABNORMAL HIGH (ref 0.00–0.15)
Total Protein, Urine: 209 mg/dL

## 2020-10-05 LAB — FETAL FIBRONECTIN: Fetal Fibronectin: POSITIVE — AB

## 2020-10-05 SURGERY — Surgical Case
Anesthesia: Spinal

## 2020-10-05 SURGERY — Surgical Case
Anesthesia: Spinal | Wound class: Clean Contaminated

## 2020-10-05 MED ORDER — LABETALOL HCL 5 MG/ML IV SOLN: 40.0000 mg | INTRAVENOUS | Status: DC | PRN

## 2020-10-05 MED ORDER — FENTANYL CITRATE (PF) 100 MCG/2ML IJ SOLN
INTRAMUSCULAR | Status: AC
  Filled 2020-10-05: qty 2

## 2020-10-05 MED ORDER — CEFAZOLIN SODIUM-DEXTROSE 2-3 GM-%(50ML) IV SOLR
INTRAVENOUS | Status: DC | PRN
  Administered 2020-10-05: 2 g via INTRAVENOUS

## 2020-10-05 MED ORDER — SODIUM CHLORIDE 0.9 % IR SOLN
Status: DC | PRN
  Administered 2020-10-05: 1000 mL

## 2020-10-05 MED ORDER — MAGNESIUM SULFATE BOLUS VIA INFUSION
4.0000 g | Freq: Once | INTRAVENOUS | Status: AC
Start: 2020-10-05 — End: 2020-10-05
  Administered 2020-10-05: 4 g via INTRAVENOUS
  Filled 2020-10-05: qty 1000

## 2020-10-05 MED ORDER — MAGNESIUM SULFATE 40 GM/1000ML IV SOLN
2.0000 g/h | INTRAVENOUS | Status: AC
  Filled 2020-10-05: qty 1000

## 2020-10-05 MED ORDER — MORPHINE SULFATE (PF) 0.5 MG/ML IJ SOLN
INTRAMUSCULAR | Status: AC
  Filled 2020-10-05: qty 10

## 2020-10-05 MED ORDER — HYDRALAZINE HCL 20 MG/ML IJ SOLN: 10.0000 mg | INTRAMUSCULAR | Status: DC | PRN

## 2020-10-05 MED ORDER — SOD CITRATE-CITRIC ACID 500-334 MG/5ML PO SOLN
30.0000 mL | Freq: Once | ORAL | Status: AC
  Administered 2020-10-05: 30 mL via ORAL
  Filled 2020-10-05: qty 15

## 2020-10-05 MED ORDER — ONDANSETRON HCL 4 MG/2ML IJ SOLN
INTRAMUSCULAR | Status: DC | PRN
  Administered 2020-10-05: 4 mg via INTRAVENOUS

## 2020-10-05 MED ORDER — LABETALOL HCL 5 MG/ML IV SOLN: 20.0000 mg | INTRAVENOUS | Status: DC | PRN

## 2020-10-05 MED ORDER — DEXAMETHASONE SODIUM PHOSPHATE 4 MG/ML IJ SOLN
INTRAMUSCULAR | Status: AC
  Filled 2020-10-05: qty 1

## 2020-10-05 MED ORDER — PHENYLEPHRINE HCL-NACL 20-0.9 MG/250ML-% IV SOLN
INTRAVENOUS | Status: AC
  Filled 2020-10-05: qty 250

## 2020-10-05 MED ORDER — SCOPOLAMINE 1 MG/3DAYS TD PT72
1.0000 | MEDICATED_PATCH | Freq: Once | TRANSDERMAL | Status: DC
  Administered 2020-10-05: 1.5 mg via TRANSDERMAL
  Filled 2020-10-05: qty 1

## 2020-10-05 MED ORDER — LABETALOL HCL 5 MG/ML IV SOLN: 80.0000 mg | INTRAVENOUS | Status: DC | PRN

## 2020-10-05 MED ORDER — FENTANYL CITRATE (PF) 100 MCG/2ML IJ SOLN
INTRAMUSCULAR | Status: DC | PRN
  Administered 2020-10-05: 10 ug via INTRATHECAL

## 2020-10-05 MED ORDER — GENTAMICIN SULFATE 40 MG/ML IJ SOLN
5.0000 mg/kg | INTRAVENOUS | Status: DC
  Filled 2020-10-05: qty 10.75

## 2020-10-05 MED ORDER — LABETALOL HCL 5 MG/ML IV SOLN
INTRAVENOUS | Status: AC
  Administered 2020-10-05: 20 mg via INTRAVENOUS
  Filled 2020-10-05: qty 4

## 2020-10-05 MED ORDER — OXYTOCIN-SODIUM CHLORIDE 30-0.9 UT/500ML-% IV SOLN
INTRAVENOUS | Status: AC
  Filled 2020-10-05: qty 500

## 2020-10-05 MED ORDER — POVIDONE-IODINE 10 % EX SWAB
2.0000 "application " | Freq: Once | CUTANEOUS | Status: DC
  Administered 2020-10-05: 2 via TOPICAL

## 2020-10-05 MED ORDER — BUPIVACAINE IN DEXTROSE 0.75-8.25 % IT SOLN
INTRATHECAL | Status: DC | PRN
  Administered 2020-10-05: 1.6 mL via INTRATHECAL

## 2020-10-05 MED ORDER — ONDANSETRON HCL 4 MG/2ML IJ SOLN
INTRAMUSCULAR | Status: AC
  Filled 2020-10-05: qty 2

## 2020-10-05 MED ORDER — LACTATED RINGERS IV SOLN: INTRAVENOUS | Status: DC

## 2020-10-05 MED ORDER — MORPHINE SULFATE (PF) 0.5 MG/ML IJ SOLN
INTRAMUSCULAR | Status: DC | PRN
  Administered 2020-10-05: .15 mg via INTRATHECAL

## 2020-10-05 MED ORDER — LABETALOL HCL 5 MG/ML IV SOLN
40.0000 mg | INTRAVENOUS | Status: DC | PRN
  Administered 2020-10-05: 23:00:00 40 mg via INTRAVENOUS
  Filled 2020-10-05: qty 8

## 2020-10-05 MED ORDER — CLINDAMYCIN PHOSPHATE 900 MG/50ML IV SOLN: 900.0000 mg | INTRAVENOUS | Status: DC

## 2020-10-05 MED ORDER — CEFAZOLIN SODIUM-DEXTROSE 2-4 GM/100ML-% IV SOLN
INTRAVENOUS | Status: AC
  Filled 2020-10-05: qty 100

## 2020-10-05 SURGICAL SUPPLY — 40 items
BENZOIN TINCTURE PRP APPL 2/3 (GAUZE/BANDAGES/DRESSINGS) ×2 IMPLANT
CHLORAPREP W/TINT 26ML (MISCELLANEOUS) ×2 IMPLANT
CLAMP CORD UMBIL (MISCELLANEOUS) ×4 IMPLANT
CLOSURE STERI STRIP 1/2 X4 (GAUZE/BANDAGES/DRESSINGS) ×2 IMPLANT
CLOTH BEACON ORANGE TIMEOUT ST (SAFETY) ×2 IMPLANT
DRSG OPSITE POSTOP 4X10 (GAUZE/BANDAGES/DRESSINGS) ×2 IMPLANT
ELECT REM PT RETURN 9FT ADLT (ELECTROSURGICAL) ×2
ELECTRODE REM PT RTRN 9FT ADLT (ELECTROSURGICAL) ×1 IMPLANT
EXTRACTOR VACUUM KIWI (MISCELLANEOUS) IMPLANT
EXTRACTOR VACUUM M CUP 4 TUBE (SUCTIONS) IMPLANT
GLOVE BIO SURGEON STRL SZ7 (GLOVE) ×2 IMPLANT
GLOVE BIOGEL PI IND STRL 7.0 (GLOVE) ×2 IMPLANT
GLOVE BIOGEL PI INDICATOR 7.0 (GLOVE) ×2
GOWN STRL REUS W/TWL LRG LVL3 (GOWN DISPOSABLE) ×4 IMPLANT
KIT ABG SYR 3ML LUER SLIP (SYRINGE) IMPLANT
NDL SAFETY ECLIPSE 18X1.5 (NEEDLE) ×1 IMPLANT
NEEDLE HYPO 18GX1.5 SHARP (NEEDLE) ×1
NEEDLE HYPO 25X5/8 SAFETYGLIDE (NEEDLE) IMPLANT
NS IRRIG 1000ML POUR BTL (IV SOLUTION) ×2 IMPLANT
PACK C SECTION WH (CUSTOM PROCEDURE TRAY) ×2 IMPLANT
PAD ABD 8X7 1/2 STERILE (GAUZE/BANDAGES/DRESSINGS) ×2 IMPLANT
PAD OB MATERNITY 4.3X12.25 (PERSONAL CARE ITEMS) ×2 IMPLANT
RTRCTR C-SECT PINK 25CM LRG (MISCELLANEOUS) IMPLANT
SPONGE GAUZE 4X4 12PLY STER LF (GAUZE/BANDAGES/DRESSINGS) ×4 IMPLANT
STRIP CLOSURE SKIN 1/2X4 (GAUZE/BANDAGES/DRESSINGS) IMPLANT
SUT MNCRL 0 VIOLET CTX 36 (SUTURE) ×2 IMPLANT
SUT MONOCRYL 0 CTX 36 (SUTURE) ×4
SUT PLAIN 0 NONE (SUTURE) IMPLANT
SUT PLAIN 2 0 (SUTURE)
SUT PLAIN ABS 2-0 CT1 27XMFL (SUTURE) IMPLANT
SUT VIC AB 0 CT1 27 (SUTURE) ×2
SUT VIC AB 0 CT1 27XBRD ANBCTR (SUTURE) ×2 IMPLANT
SUT VIC AB 2-0 CT1 27 (SUTURE) ×1
SUT VIC AB 2-0 CT1 TAPERPNT 27 (SUTURE) ×1 IMPLANT
SUT VIC AB 4-0 KS 27 (SUTURE) ×2 IMPLANT
SUT VICRYL 0 TIES 12 18 (SUTURE) IMPLANT
TAPE MEDIFIX FOAM 3 (GAUZE/BANDAGES/DRESSINGS) ×2 IMPLANT
TOWEL OR 17X24 6PK STRL BLUE (TOWEL DISPOSABLE) ×2 IMPLANT
TRAY FOLEY W/BAG SLVR 14FR LF (SET/KITS/TRAYS/PACK) IMPLANT
WATER STERILE IRR 1000ML POUR (IV SOLUTION) ×2 IMPLANT

## 2020-10-05 NOTE — Anesthesia Procedure Notes (Signed)
Spinal  Patient location during procedure: OR Start time: 10/05/2020 11:30 PM End time: 10/05/2020 11:35 PM Staffing Performed: anesthesiologist  Anesthesiologist: Mellody Dance, MD Preanesthetic Checklist Completed: patient identified, IV checked, risks and benefits discussed, surgical consent, monitors and equipment checked, pre-op evaluation and timeout performed Spinal Block Patient position: sitting Prep: DuraPrep Patient monitoring: cardiac monitor, continuous pulse ox and blood pressure Approach: midline Location: L3-4 Injection technique: single-shot Needle Needle type: Pencan  Needle gauge: 24 G Needle length: 9 cm Additional Notes Functioning IV was confirmed and monitors were applied. Sterile prep and drape, including hand hygiene and sterile gloves were used. The patient was positioned and the spine was prepped. The skin was anesthetized with lidocaine.  Free flow of clear CSF was obtained prior to injecting local anesthetic into the CSF.  The spinal needle aspirated freely following injection.  The needle was carefully withdrawn.  The patient tolerated the procedure well.

## 2020-10-05 NOTE — Anesthesia Preprocedure Evaluation (Deleted)
Anesthesia Evaluation  Patient identified by MRN, date of birth, ID band Patient awake    Reviewed: Allergy & Precautions, H&P , NPO status , Patient's Chart, lab work & pertinent test results  Airway Mallampati: II  TM Distance: >3 FB Neck ROM: Full    Dental no notable dental hx.    Pulmonary neg pulmonary ROS,    Pulmonary exam normal breath sounds clear to auscultation       Cardiovascular Exercise Tolerance: Good hypertension, Pt. on home beta blockers and Pt. on medications Normal cardiovascular exam Rhythm:Regular Rate:Normal     Neuro/Psych negative neurological ROS  negative psych ROS   GI/Hepatic Neg liver ROS, GERD  Medicated,  Endo/Other  negative endocrine ROS  Renal/GU negative Renal ROS  negative genitourinary   Musculoskeletal negative musculoskeletal ROS (+)   Abdominal   Peds negative pediatric ROS (+)  Hematology negative hematology ROS (+)   Anesthesia Other Findings   Reproductive/Obstetrics (+) Pregnancy (twins)                             Anesthesia Physical Anesthesia Plan  ASA: III  Anesthesia Plan: Spinal   Post-op Pain Management:    Induction:   PONV Risk Score and Plan: 2 and Treatment may vary due to age or medical condition and Ondansetron  Airway Management Planned: Natural Airway  Additional Equipment:   Intra-op Plan:   Post-operative Plan:   Informed Consent: I have reviewed the patients History and Physical, chart, labs and discussed the procedure including the risks, benefits and alternatives for the proposed anesthesia with the patient or authorized representative who has indicated his/her understanding and acceptance.       Plan Discussed with: CRNA, Anesthesiologist and Surgeon  Anesthesia Plan Comments: (Patient had a granola bar at 1930. Dr. Juliene Pina has deemed case emergent as patient continues to have severe range blood  pressures. Will plan for spinal anesthetic. GETA as backup plan. Tanna Furry, MD  )        Anesthesia Quick Evaluation

## 2020-10-05 NOTE — MAU Note (Signed)
Patient states she woke up this morning with abdominal cramping that lasted all day today.  Also started having nausea and some SOB w/ right sided epigastric pain.  Also reports seeing some floaters while at rest.  Denies any HA.  Took her BP meds at 1000 today but hasn't taken her BP today.

## 2020-10-05 NOTE — MAU Provider Note (Addendum)
History     CSN: [id number]  Arrival date and time: 10/05/20 1954   None     Chief Complaint  Patient presents with  . Hypertension   HPI Patient Desiree Miller is a 30 y.o. G2P0010  at [redacted]w[redacted]d here with complaints of sudden blurry vision, floating spots, RUQ pain.  She also endorses some cramping and nausea, denies dysuria, diarrhea. She denies vaginal bleeding, loss of fluid, decreased fetal movements. She reports that both babies are moving actively.   She was inpatient on 3/2 for elevated BPs, was diagnosed with pre-e without severe features and discharged home on 10/04/2020 with 100 mg of labetalol BID. She received BMZ x 2 while IP.   She also has growth restriction in baby A by [facility] measurement of 8%. She is planning a c/section.   Patient states that she started to have upper abdominal pain and called Dr. Juliene Pina. Since she arrived at MAU she is now feeling blurry vision and floating spots.   OB History     Gravida  2   Para      Term      Preterm      AB  1   Living  0      SAB      IAB  1   Ectopic      Multiple      Live Births              Past Medical History:  Diagnosis Date  . Anxiety     Past Surgical History:  Procedure Laterality Date  . WISDOM TOOTH EXTRACTION      No family history on file.  Social History   Tobacco Use  . Smoking status: Never Smoker  . Smokeless tobacco: Never Used  Vaping Use  . Vaping Use: Never used  Substance Use Topics  . Alcohol use: Not Currently  . Drug use: Never    Allergies:  Allergies  Allergen Reactions  . Penicillins Rash    Medications Prior to Admission  Medication Sig Dispense Refill Last Dose  . famotidine (PEPCID) 20 MG tablet Take 20 mg by mouth 2 (two) times daily.   10/05/2020 at Unknown time  . labetalol (NORMODYNE) 100 MG tablet Take 1 tablet (100 mg total) by mouth 2 (two) times daily. 60 tablet 3 10/05/2020 at 1000  . Prenatal Vit-Fe Fumarate-FA (PRENATAL MULTIVITAMIN) TABS  tablet Take 1 tablet by mouth daily at 12 noon.   10/05/2020 at Unknown time  . ferrous sulfate 325 (65 FE) MG tablet Take 325 mg by mouth every other day.     . sertraline (ZOLOFT) 25 MG tablet Take 25 mg by mouth at bedtime.       Review of Systems  Constitutional: Negative.   HENT: Negative.   Respiratory: Negative.   Gastrointestinal: Positive for abdominal pain.       RIght upper quadrant pain as well as lower abdominal cramping  Genitourinary: Negative.   Musculoskeletal: Negative.   Neurological:       Blurry vision and floating spots   Physical Exam   Blood pressure (!) 159/91, pulse 75, temperature 98.7 F (37.1 C), resp. rate 17, weight 86.3 kg, last menstrual period 02/13/2020, SpO2 97 %.  Physical Exam Cardiovascular:     Rate and Rhythm: Normal rate.  Genitourinary:    General: Normal vulva.  Musculoskeletal:        General: Normal range of motion.  Neurological:     Mental Status: She  is alert.     MAU Course  Procedures  MDM -TC to Dr. Juliene Pina at 2100 to update provider on patients complaint and presentation and possible need for admission/c/section. Will draw labs and observer closely.  -Dr. Juliene Pina called back 2115; updated her on patient's cervix: 2 cm, soft.  -Patient now with severe range pressures at 2116 and 2131; will start antihypertensive protocol -CBC: Platelets improved -FFn is positive   Reassessment at 1041: Patient's cervix at 2110 was 2 cm, thick, now 3 cm, thinning out with palpable fetal parts. BPs have been severe range x 3; has received IV meds x 3; Dr. Juliene Pina notified of patient's lab work, contractions, cervical exam and symptoms. She is en route to consent patient. Will start MgSo4 in MAU. COVID test does not need to be repeated.    NST: Baby A: 135 bpm, mod var, present acel, no decels,  Baby B: 135 bpm, mod var, present acel, no decels Contractions q 4, patient reports that they are getting stronger.   [place] used to confirm two separate  heartbeats; strong movement observed while confirming two distinct heartbeats.  Patient Vitals for the past 24 hrs:  BP Temp Pulse Resp SpO2 Weight  10/05/20 2131 (!) 163/96 -- 74 -- -- --  10/05/20 2116 (!) 161/95 -- 77 -- -- --  10/05/20 2101 (!) 156/99 -- 75 -- -- --  10/05/20 2046 (!) 157/98 -- 75 -- -- --  10/05/20 2043 (!) 148/95 -- 76 -- -- --  10/05/20 2030 (!) 159/91 -- 75 -- 97 % --  10/05/20 2024 (!) 154/97 -- 75 -- -- --  10/05/20 2005 (!) 155/93 98.7 F (37.1 C) 76 -- -- --  10/05/20 2004 -- -- -- 17 -- --  10/05/20 2002 -- -- -- -- -- 86.3 kg    Assessment and Plan  -Pre-e w/severe features and pre-term labor -Plan for immediate C/section; Dr. Juliene Pina assumes care of patient at 2300.   Charlesetta Garibaldi Shanasia Ibrahim 10/05/2020, 8:56 PM

## 2020-10-05 NOTE — H&P (Addendum)
Desiree Miller is a 30 y.o. female presenting for abdominal pain and on arrival reporting vision floaters.  Diagnosed with Preeclampsia- mild range BPs last week and was discharged on 10/03/20 with Labetalol 100mg  tid.  Called with upper abdo pain and was asked to come in for evaluation. Denies HA.       DCDA twin pregnancy 33.4 wks. S/p BTMZ last week.   Patient has been followed with serial growth for DCDA twin pregnancy. Most recent scan in office on 2/25 showed growth discordance of 3% with baby A breech EFW 12% (3#3) with AC 8% ant placenta and normal fluuid, Baby B vertex EFW 16% (3#15) with AC 48% ant placenta and normal fluid.  Spontaneous twin pregnancy dated by LMP c/w 8w sono and routine PNC at [facility]. History of TOP x1. Patient has history of anxiety and has been on Zoloft throughout. Has had some acid reflux managed with Pepcid BID. Otherwise uncomplicated. Panorama NIPS low risk x2, AFP negative screen, and normal anatomy scans. Third trimester labs significant for mild thrombocytopenia with platelets of 125 on 09/27/20  (initially 220 at start of pregnancy) and anemia with Hgb 10.5, started on PO iron. 1hr GTT WNL at 120.            OB History    Gravida  2   Para      Term      Preterm      AB  1   Living  0     SAB      IAB  1   Ectopic      Multiple      Live Births                 Past Medical History:  Diagnosis Date  . Anxiety         Past Surgical History:  Procedure Laterality Date  . WISDOM TOOTH EXTRACTION     Family History: family history is not on file. Social History:  reports that she has never smoked. She has never used smokeless tobacco. She reports previous alcohol use. She reports that she does not use drugs.     Maternal Diabetes: No Genetic Screening: Normal Maternal Ultrasounds/Referrals: Normal Fetal Ultrasounds or other Referrals:  None Maternal Substance Abuse:  No Significant Maternal Medications:  Meds  include: Zoloft Significant Maternal Lab Results:  None Other Comments:  DiDi twins   Review of Systems  All other systems reviewed and are negative.  Per HPI Exam Physical Exam  BP (!) 150/94   Pulse 80   Temp 98.7 F (37.1 C)   Resp 17   Wt 86.3 kg   LMP 02/13/2020   SpO2 95%   BMI 31.17 kg/m  Patient Vitals for the past 24 hrs:  BP Temp Pulse Resp SpO2 Weight  10/05/20 2302 (!) 150/94 -- 80 -- -- --  10/05/20 2231 (!) 160/96 -- 70 -- -- --  10/05/20 2216 (!) 157/93 -- 70 -- -- --  10/05/20 2201 (!) 151/95 -- 71 -- -- --  10/05/20 2150 (!) 155/95 -- 76 -- 95 % --  10/05/20 2131 (!) 163/96 -- 74 -- -- --  10/05/20 2116 (!) 161/95 -- 77 -- -- --  10/05/20 2101 (!) 156/99 -- 75 -- -- --  10/05/20 2046 (!) 157/98 -- 75 -- -- --  10/05/20 2043 (!) 148/95 -- 76 -- -- --  10/05/20 2030 (!) 159/91 -- 75 -- 97 % --  10/05/20 2024 2025)  154/97 -- 75 -- -- --  10/05/20 2005 (!) 155/93 98.7 F (37.1 C) 76 -- -- --  10/05/20 2004 -- -- -- 17 -- --  10/05/20 2002 -- -- -- -- -- 86.3 kg   General: AAO, NAD Heart: RRR no audible murmurs Lungs: CTABL no rales no crackles Abdomen: gravid uterus, non-tender to palpation Ext: trace b/l LE edema, neg Homan Neuro: +1 DTR's b/l UE, +3 DTR's b/l LE, no clonus Cervix change in MAU from 2 cm to 3 cm and softer, fetal parts at inlet, membranes intct  Fetal Monitoring:  -Baby A: cat I baseline 140 bpm mod var +accels,-decels  -Baby B: cat I baseline 145 bpm mod var +accels, -decels  Toco: rare ctx, some intermittent uterine irritability  Prenatal labs: ABO, Rh:  --/--/B POS (03/02 1605) Antibody: NEG (03/02 1605) Rubella:  Immune RPR:   NR HBsAg:   NEG HIV:   NR GBS:   n/a  CBC Latest Ref Rng & Units 10/05/2020 10/03/2020 10/02/2020  WBC 4.0 - 10.5 K/uL 11.9(H) 8.9 7.1  Hemoglobin 12.0 - 15.0 g/dL 6.7(E) 9.3(Y) 10.5(L)  Hematocrit 36.0 - 46.0 % 27.5(L) 27.6(L) 30.8(L)  Platelets 150 - 400 K/uL 165 132(L) 138(L)   CMP Latest  Ref Rng & Units 10/05/2020 10/03/2020 10/02/2020  Glucose 70 - 99 mg/dL 73 101(B) 94  BUN 6 - 20 mg/dL 9 10 9   Creatinine 0.44 - 1.00 mg/dL 5.10 2.58  Sodium 135 - 145 mmol/L 135 134(L) 136  Potassium 3.5 - 5.1 mmol/L 4.1 4.4 4.3  Chloride 98 - 111 mmol/L 106 105 106  CO2 22 - 32 mmol/L 20(L) 19(L) 21(L)  Calcium 8.9 - 10.3 mg/dL 9.1 5.27) 8.9  Total Protein 6.5 - 8.1 g/dL 5.9(L) 5.4(L) 6.0(L)  Total Bilirubin 0.3 - 1.2 mg/dL 0.8 0.4 0.3  Alkaline Phos 38 - 126 U/L 157(H) 135(H) 162(H)  AST 15 - 41 U/L 30 23 28   ALT 0 - 44 U/L 28 29 37     Assessment/Plan: [name] is a 30 y.o. female [id number] DCDA twin pregnancy at 33w 4d with Preeclampsia, severe range BPs and preterm labor. S/p BTMZ few days back. Proceed with C/s for Twin A breech.  S/p IV Labetalol 3 doses in MAU for severe range BPs  Anxiety ++ Risks/complications of surgery reviewed incl infection, bleeding, damage to internal organs including bladder, bowels, ureters, blood vessels, other risks from anesthesia, VTE and delayed complications of any surgery, complications in future surgery reviewed. Also discussed neonatal complications incl difficult delivery, laceration, vacuum assistance, TTN etc. Pt understands and agrees, all concerns addressed.    Risk of prematurity d/w pt and indication to proceed with C/section now.            --38 MD

## 2020-10-05 NOTE — Anesthesia Preprocedure Evaluation (Addendum)
Anesthesia Evaluation  Patient identified by MRN, date of birth, ID band Patient awake    Reviewed: Allergy & Precautions, H&P , NPO status , Patient's Chart, lab work & pertinent test results  Airway Mallampati: II  TM Distance: >3 FB Neck ROM: Full    Dental no notable dental hx.    Pulmonary neg pulmonary ROS,    Pulmonary exam normal breath sounds clear to auscultation       Cardiovascular Exercise Tolerance: Good hypertension, Pt. on home beta blockers and Pt. on medications Normal cardiovascular exam Rhythm:Regular Rate:Normal     Neuro/Psych Anxiety negative neurological ROS  negative psych ROS   GI/Hepatic Neg liver ROS, GERD  Medicated,  Endo/Other  negative endocrine ROS  Renal/GU negative Renal ROS  negative genitourinary   Musculoskeletal negative musculoskeletal ROS (+)   Abdominal   Peds negative pediatric ROS (+)  Hematology  (+) anemia ,   Anesthesia Other Findings   Reproductive/Obstetrics (+) Pregnancy (twins)                            Anesthesia Physical  Anesthesia Plan  ASA: III and emergent  Anesthesia Plan: Spinal   Post-op Pain Management:    Induction:   PONV Risk Score and Plan: 2 and Treatment may vary due to age or medical condition and Ondansetron  Airway Management Planned: Natural Airway  Additional Equipment:   Intra-op Plan:   Post-operative Plan:   Informed Consent: I have reviewed the patients History and Physical, chart, labs and discussed the procedure including the risks, benefits and alternatives for the proposed anesthesia with the patient or authorized representative who has indicated his/her understanding and acceptance.       Plan Discussed with: CRNA, Anesthesiologist and Surgeon  Anesthesia Plan Comments: (Patient had a granola bar at 1930. Dr. Juliene Pina has deemed case emergent as patient continues to have severe range blood  pressures. Will plan for spinal anesthetic. GETA as backup plan. Tanna Furry, MD  )       Anesthesia Quick Evaluation

## 2020-10-06 ENCOUNTER — Encounter (HOSPITAL_COMMUNITY): Payer: Self-pay | Admitting: Obstetrics & Gynecology

## 2020-10-06 DIAGNOSIS — Z8759 Personal history of other complications of pregnancy, childbirth and the puerperium: Secondary | ICD-10-CM

## 2020-10-06 DIAGNOSIS — O30049 Twin pregnancy, dichorionic/diamniotic, unspecified trimester: Secondary | ICD-10-CM | POA: Diagnosis present

## 2020-10-06 DIAGNOSIS — O1413 Severe pre-eclampsia, third trimester: Secondary | ICD-10-CM | POA: Diagnosis present

## 2020-10-06 HISTORY — DX: Twin pregnancy, dichorionic/diamniotic, unspecified trimester: O30.049

## 2020-10-06 HISTORY — DX: Personal history of other complications of pregnancy, childbirth and the puerperium: Z87.59

## 2020-10-06 HISTORY — DX: Severe pre-eclampsia, third trimester: O14.13

## 2020-10-06 LAB — POSTPARTUM HEMORRHAGE PROTOCOL (BB NOTIFICATION)

## 2020-10-06 LAB — CBC
HCT: 23.8 % — ABNORMAL LOW (ref 36.0–46.0)
Hemoglobin: 8.1 g/dL — ABNORMAL LOW (ref 12.0–15.0)
MCH: 34 pg (ref 26.0–34.0)
MCHC: 34 g/dL (ref 30.0–36.0)
MCV: 100 fL (ref 80.0–100.0)
Platelets: 145 10*3/uL — ABNORMAL LOW (ref 150–400)
RBC: 2.38 MIL/uL — ABNORMAL LOW (ref 3.87–5.11)
RDW: 13.7 % (ref 11.5–15.5)
WBC: 14.4 10*3/uL — ABNORMAL HIGH (ref 4.0–10.5)
nRBC: 0.6 % — ABNORMAL HIGH (ref 0.0–0.2)

## 2020-10-06 LAB — DIC (DISSEMINATED INTRAVASCULAR COAGULATION)PANEL
D-Dimer, Quant: >20 ug/mL-FEU — ABNORMAL HIGH (ref 0.00–0.50)
Fibrinogen: 250 mg/dL (ref 210–475)
INR: 1.2 (ref 0.8–1.2)
Platelets: 146 10*3/uL — ABNORMAL LOW (ref 150–400)
Prothrombin Time: 14.5 seconds (ref 11.4–15.2)
Smear Review: NONE SEEN
aPTT: 30 seconds (ref 24–36)

## 2020-10-06 LAB — RPR: RPR Ser Ql: NONREACTIVE

## 2020-10-06 MED ORDER — ACETAMINOPHEN 500 MG PO TABS
1000.0000 mg | ORAL_TABLET | Freq: Four times a day (QID) | ORAL | Status: AC
  Administered 2020-10-06 – 2020-10-07 (×4): 1000 mg via ORAL
  Filled 2020-10-06 (×4): qty 2

## 2020-10-06 MED ORDER — CARBOPROST TROMETHAMINE 250 MCG/ML IM SOLN: 250.0000 ug | Freq: Once | INTRAMUSCULAR | Status: AC

## 2020-10-06 MED ORDER — OXYTOCIN-SODIUM CHLORIDE 30-0.9 UT/500ML-% IV SOLN
2.5000 [IU]/h | INTRAVENOUS | Status: AC
  Administered 2020-10-06 (×4): 2.5 [IU]/h via INTRAVENOUS
  Filled 2020-10-06 (×2): qty 500

## 2020-10-06 MED ORDER — MISOPROSTOL 200 MCG PO TABS
ORAL_TABLET | ORAL | Status: AC
  Administered 2020-10-06: 1000 ug via RECTAL
  Filled 2020-10-06: qty 5

## 2020-10-06 MED ORDER — SODIUM CHLORIDE 0.9 % IR SOLN
Status: DC | PRN
  Administered 2020-10-06: 200 mL

## 2020-10-06 MED ORDER — DIPHENHYDRAMINE HCL 25 MG PO CAPS: 25.0000 mg | ORAL_CAPSULE | ORAL | Status: DC | PRN

## 2020-10-06 MED ORDER — ZOLPIDEM TARTRATE 5 MG PO TABS: 5.0000 mg | ORAL_TABLET | Freq: Every evening | ORAL | Status: DC | PRN

## 2020-10-06 MED ORDER — NALBUPHINE HCL 10 MG/ML IJ SOLN: 5.0000 mg | INTRAMUSCULAR | Status: DC | PRN

## 2020-10-06 MED ORDER — CARBOPROST TROMETHAMINE 250 MCG/ML IM SOLN
INTRAMUSCULAR | Status: AC
  Administered 2020-10-06: 250 ug
  Filled 2020-10-06: qty 1

## 2020-10-06 MED ORDER — NALOXONE HCL 0.4 MG/ML IJ SOLN: 0.4000 mg | INTRAMUSCULAR | Status: DC | PRN

## 2020-10-06 MED ORDER — KETOROLAC TROMETHAMINE 30 MG/ML IJ SOLN
30.0000 mg | Freq: Four times a day (QID) | INTRAMUSCULAR | Status: AC | PRN
  Filled 2020-10-06: qty 1

## 2020-10-06 MED ORDER — ONDANSETRON HCL 4 MG/2ML IJ SOLN: 4.0000 mg | Freq: Three times a day (TID) | INTRAMUSCULAR | Status: DC | PRN

## 2020-10-06 MED ORDER — FENTANYL CITRATE (PF) 100 MCG/2ML IJ SOLN
INTRAMUSCULAR | Status: AC
  Administered 2020-10-06: 50 ug
  Filled 2020-10-06: qty 2

## 2020-10-06 MED ORDER — ACETAMINOPHEN 10 MG/ML IV SOLN
INTRAVENOUS | Status: AC
  Filled 2020-10-06: qty 100

## 2020-10-06 MED ORDER — PRENATAL MULTIVITAMIN CH
1.0000 | ORAL_TABLET | Freq: Every day | ORAL | Status: DC
  Administered 2020-10-07 – 2020-10-10 (×4): 1 via ORAL
  Filled 2020-10-06 (×4): qty 1

## 2020-10-06 MED ORDER — OXYCODONE HCL 5 MG PO TABS
5.0000 mg | ORAL_TABLET | Freq: Once | ORAL | Status: DC | PRN
Start: 2020-10-06 — End: 2020-10-06

## 2020-10-06 MED ORDER — KETOROLAC TROMETHAMINE 30 MG/ML IJ SOLN
30.0000 mg | Freq: Four times a day (QID) | INTRAMUSCULAR | Status: AC | PRN
  Administered 2020-10-06: 30 mg via INTRAMUSCULAR

## 2020-10-06 MED ORDER — SODIUM CHLORIDE 0.9% FLUSH: 3.0000 mL | INTRAVENOUS | Status: DC | PRN

## 2020-10-06 MED ORDER — DIPHENHYDRAMINE HCL 25 MG PO CAPS
25.0000 mg | ORAL_CAPSULE | Freq: Once | ORAL | Status: AC
  Administered 2020-10-06: 25 mg via ORAL
  Filled 2020-10-06: qty 1

## 2020-10-06 MED ORDER — SODIUM CHLORIDE 0.9 % IV SOLN
300.0000 mg | Freq: Once | INTRAVENOUS | Status: AC
  Administered 2020-10-06: 300 mg via INTRAVENOUS
  Filled 2020-10-06: qty 15

## 2020-10-06 MED ORDER — PHENYLEPHRINE HCL-NACL 20-0.9 MG/250ML-% IV SOLN
INTRAVENOUS | Status: DC | PRN
  Administered 2020-10-05: 60 ug/min via INTRAVENOUS

## 2020-10-06 MED ORDER — FENTANYL CITRATE (PF) 100 MCG/2ML IJ SOLN
INTRAMUSCULAR | Status: AC
  Filled 2020-10-06: qty 2

## 2020-10-06 MED ORDER — AMISULPRIDE (ANTIEMETIC) 5 MG/2ML IV SOLN: 10.0000 mg | Freq: Once | INTRAVENOUS | Status: DC | PRN

## 2020-10-06 MED ORDER — DIPHENHYDRAMINE HCL 50 MG/ML IJ SOLN: 12.5000 mg | INTRAMUSCULAR | Status: DC | PRN

## 2020-10-06 MED ORDER — SODIUM CHLORIDE 0.9 % IV SOLN: INTRAVENOUS | Status: DC | PRN

## 2020-10-06 MED ORDER — MEPERIDINE HCL 25 MG/ML IJ SOLN: 6.2500 mg | INTRAMUSCULAR | Status: DC | PRN

## 2020-10-06 MED ORDER — NALBUPHINE HCL 10 MG/ML IJ SOLN: 5.0000 mg | Freq: Once | INTRAMUSCULAR | Status: DC | PRN

## 2020-10-06 MED ORDER — LACTATED RINGERS IV SOLN: INTRAVENOUS | Status: AC

## 2020-10-06 MED ORDER — NALOXONE HCL 4 MG/10ML IJ SOLN
1.0000 ug/kg/h | INTRAVENOUS | Status: DC | PRN
  Filled 2020-10-06: qty 5

## 2020-10-06 MED ORDER — FENTANYL CITRATE (PF) 100 MCG/2ML IJ SOLN: 50.0000 ug | Freq: Once | INTRAMUSCULAR | Status: AC

## 2020-10-06 MED ORDER — LACTATED RINGERS IV BOLUS: 1000.0000 mL | Freq: Once | INTRAVENOUS | Status: AC

## 2020-10-06 MED ORDER — KETOROLAC TROMETHAMINE 30 MG/ML IJ SOLN
INTRAMUSCULAR | Status: AC
  Filled 2020-10-06: qty 1

## 2020-10-06 MED ORDER — PROMETHAZINE HCL 25 MG/ML IJ SOLN: 6.2500 mg | INTRAMUSCULAR | Status: DC | PRN

## 2020-10-06 MED ORDER — OXYTOCIN-SODIUM CHLORIDE 30-0.9 UT/500ML-% IV SOLN
INTRAVENOUS | Status: DC | PRN
  Administered 2020-10-06: 30 [IU] via INTRAVENOUS

## 2020-10-06 MED ORDER — DIPHENOXYLATE-ATROPINE 2.5-0.025 MG PO TABS
2.0000 | ORAL_TABLET | Freq: Once | ORAL | Status: AC
  Administered 2020-10-06: 2 via ORAL
  Filled 2020-10-06: qty 2

## 2020-10-06 MED ORDER — OXYCODONE HCL 5 MG PO TABS
5.0000 mg | ORAL_TABLET | Freq: Four times a day (QID) | ORAL | Status: DC | PRN
  Administered 2020-10-07: 5 mg via ORAL
  Filled 2020-10-06: qty 1

## 2020-10-06 MED ORDER — MISOPROSTOL 200 MCG PO TABS: 1000.0000 ug | ORAL_TABLET | Freq: Once | ORAL | Status: AC

## 2020-10-06 MED ORDER — TRANEXAMIC ACID-NACL 1000-0.7 MG/100ML-% IV SOLN
INTRAVENOUS | Status: AC
  Administered 2020-10-06: 1000 mg
  Filled 2020-10-06: qty 100

## 2020-10-06 MED ORDER — SENNOSIDES-DOCUSATE SODIUM 8.6-50 MG PO TABS
2.0000 | ORAL_TABLET | Freq: Every day | ORAL | Status: DC
  Administered 2020-10-07 – 2020-10-10 (×4): 2 via ORAL
  Filled 2020-10-06 (×4): qty 2

## 2020-10-06 MED ORDER — DEXAMETHASONE SODIUM PHOSPHATE 4 MG/ML IJ SOLN
INTRAMUSCULAR | Status: DC | PRN
  Administered 2020-10-06: 4 mg via INTRAVENOUS

## 2020-10-06 MED ORDER — OXYCODONE HCL 5 MG/5ML PO SOLN: 5.0000 mg | Freq: Once | ORAL | Status: DC | PRN

## 2020-10-06 MED ORDER — ACETAMINOPHEN 10 MG/ML IV SOLN
1000.0000 mg | Freq: Once | INTRAVENOUS | Status: DC | PRN
  Administered 2020-10-06: 1000 mg via INTRAVENOUS

## 2020-10-06 MED ORDER — OXYTOCIN-SODIUM CHLORIDE 30-0.9 UT/500ML-% IV SOLN
INTRAVENOUS | Status: AC
  Filled 2020-10-06: qty 500

## 2020-10-06 MED ORDER — FENTANYL CITRATE (PF) 100 MCG/2ML IJ SOLN
25.0000 ug | INTRAMUSCULAR | Status: DC | PRN
  Administered 2020-10-06: 50 ug via INTRAVENOUS
  Administered 2020-10-06 (×2): 25 ug via INTRAVENOUS
  Administered 2020-10-06: 50 ug via INTRAVENOUS

## 2020-10-06 NOTE — Progress Notes (Signed)
   10/06/20 0255  Clinical Encounter Type  Visited With Patient not available  Visit Type Code  Referral From Nurse  Consult/Referral To Chaplain   Chaplain responded to code. When Chaplain arrived, chaplain services were not needed. Chaplain remains available.   This note was prepared by Chaplain Resident, Tacy Learn, MDiv. Chaplain remains available as needed through the on-call pager: [phone].

## 2020-10-06 NOTE — Progress Notes (Signed)
Mody MD contacted about pt status.  Pt is doing well, no nausea, maintaining O2 above 94% at room air, no other concerns.  Orders placed.

## 2020-10-06 NOTE — Transfer of Care (Signed)
Immediate Anesthesia Transfer of Care Note  Patient: Desiree Miller  Procedure(s) Performed: CESAREAN SECTION (N/A )  Patient Location: PACU  Anesthesia Type:Spinal  Level of Consciousness: awake, alert  and oriented  Airway & Oxygen Therapy: Patient Spontanous Breathing  Post-op Assessment: Report given to RN and Post -op Vital signs reviewed and stable  Post vital signs: Reviewed and stable  Last Vitals:  Vitals Value Taken Time  BP 139/91 10/06/20 0051  Temp    Pulse 77 10/06/20 0053  Resp 18 10/06/20 0053  SpO2 97 % 10/06/20 0053  Vitals shown include unvalidated device data.  Last Pain:  Vitals:   10/05/20 2004  PainSc: 6          Complications: No complications documented.

## 2020-10-06 NOTE — Anesthesia Postprocedure Evaluation (Signed)
Anesthesia Post Note  Patient: Desiree Miller  Procedure(s) Performed: CESAREAN SECTION (N/A )     Patient location during evaluation: PACU Anesthesia Type: Spinal Level of consciousness: oriented and awake and alert Pain management: pain level controlled Vital Signs Assessment: post-procedure vital signs reviewed and stable Respiratory status: spontaneous breathing and respiratory function stable Cardiovascular status: blood pressure returned to baseline and stable Postop Assessment: no headache, no backache, no apparent nausea or vomiting and able to ambulate Anesthetic complications: no   No complications documented.  Last Vitals:  Vitals:   10/06/20 0200 10/06/20 0230  BP: 137/90   Pulse: 70   Resp: 16   Temp: 37.7 C 36.9 C  SpO2: (!) 89%     Last Pain:  Vitals:   10/06/20 0230  TempSrc: Oral  PainSc:    Pain Goal:                   Desiree Miller

## 2020-10-06 NOTE — Progress Notes (Signed)
OB Note CTSP for increased bleeding  Patient still passing clots and I was called to assess; primary OB on the way and hemabate ordered to be given in addition to the pitocin and to stop the Mg.  On my arrival, IVF bolus infusing and RN doing fundal massage and fundus at the umbilicus and deviated to the right; foley cathether in place with no UOP.  I ordered Lysteda and to give a simultaneous both of IVF at the other IV site to have two infusing at one. Clots expressed and weighed for total of approximately 700-800 since being on the unit. Fundus firm at the umbilicus and deviated a little to the right. The lower uterine segment felt firm. No more clots coming out and PPH labs drawn. Pt pale and lethargic with normal HR and BP; [place] added for O2 in the low 90s.  Will continue to follow closely. Lomotil from the pharmacy requested  Cornelia Copa MD Attending Center for [facility] (Faculty Practice) 10/06/2020 Time: [phone]

## 2020-10-06 NOTE — Lactation Note (Signed)
This note was copied from a baby's chart. Lactation Consultation Note  Patient Name: Desiree Miller [id number] Date: 10/06/2020 Reason for consult: Initial assessment;Primapara;1st time breastfeeding;Infant < 6lbs;Multiple gestation;NICU baby;Preterm <34wks PPH, GHTN on MgSO4 Age:31 hours   Twin boys A-Francisco  B-[name]  LC in to visit with P2 Mom of preterm twins in the NICU.   Mom had a PPH 1300 EBL.  Mom is on MgSO4. Mom is exhausted and resting.    Mom desires to breastfeed and would like to start pumping after she sleeps another hour.  RN aware.  LC set up DEBP and gave a basic review including sizing the flanges.  Also briefly reviewed breast massage and hand expression and provided colostrum containers.  LC wrote on dry erase board- 1- breast massage and hand expression 2- Pump both breasts every 2-3 hrs on initiation setting. 3-Goal of pumping 8 times per 24 hrs.  NICU booklet and Lactation brochure provided to Mom.  Mom will call her RN for assistance with pumping and washing of pump parts. Wash and drying bins provided.  Mom aware of lactation support while babies are in the NICU.    Lactation Tools Discussed/Used Tools: Pump Breast pump type: Double-Electric Breast Pump Pump Education: Setup, frequency, and cleaning;Milk Storage Reason for Pumping: Support milk supply/preterm twins in NICU Pumping frequency: will start when she is feeling better Pumped volume: 0 mL  Interventions Interventions: Breast feeding basics reviewed;Skin to skin;Breast massage;Hand express;DEBP  Discharge Pump: Personal;DEBP (Spectra DEBP from insurance) WIC Program: No  Consult Status Consult Status: Follow-up Date: 10/07/20 Follow-up type: In-patient    Desiree Miller 10/06/2020, 8:22 AM

## 2020-10-06 NOTE — Progress Notes (Signed)
Patient ID: Desiree Miller, female   DOB: [birth year], 30 y.o.   MRN: [id number] Came to see patient after RN called reporting passage of 350 cc clot in her room after arriving from PACU. She was advised to give Hemabate and stop magnesium as I was heading in to see her. Faculty attendance was requested while I was en route.  Upon arrival, patient had 2nd IV running open with LR, pitocin at usual 41.5 rate, Lysteda was completed. I placed Cytotec rectally. Gloves changes and bimanual massage performed and more clots from uterus and posterior vaginal wall removed. Now total EBL in postpartum room was 1300 cc per RN per [facility], about 1800 cc including OR+PACU+PP room.  VS reviewed, RN reported one low BP on 90/40s and patient appeared pale and O2 sat down to 91 then. But repeat BP was in 130-140s /80s again, so possible vasovagal since it improved immediately. Urine output still minimal.  Pt A&O x 3 Abdo soft, now uterus 1 cm below U and firm and no more active vaginal bleeding noted  Coags and CBC pending   Total meds- Hemabate x 1, Lysteda x 1, Cytotec 1000 mcg and current pitocin bag -300 cc bolus f/by regular rate and continue pitocin for 24hrs.  Plan to hold 2nd LR to avoid fluid overload due to preeclampsia  Plan to restart magnesium at 4 am at 1 gm /hr rate and reassess at 7 am if we can go up to 2 gm /hr rate again  Pt and husband counseled, d/w them uterine atony - twins and magnesium and C/s at risk factors. Accepts blood transfusion if needed, will reassess after CBC back.   --V.Lillyian Heidt MD

## 2020-10-06 NOTE — Addendum Note (Signed)
Addendum  created 10/06/20 0724 by Elgie Congo, CRNA   Intraprocedure Event deleted, Intraprocedure Event edited

## 2020-10-06 NOTE — Op Note (Signed)
Cesarean Section Procedure Note  Desiree Miller 10/06/2020  Procedure: Primary Low Transverse Cesarean section.                    (two layer closure)   Indications: Breech Presentation twin A, Dichorionic diamniotic twins, preterm labor 33.4 weeks, severe preeclampsia  Pre-operative Diagnosis: Cesarean section for Malpresentation with twins, early labor, Preeclampsia. 33.4 weeks  Post-operative Diagnosis: Same   Surgeon:  Shea Evans, MD   Assistants: Arlan Organ, CNM  Anesthesia: spinal   Procedure Details:  The patient was seen and assessed in the Triage Room. Preterm labor diagnosed, 33.4 wks, s/p Betamethasone earlier this week when admitted for Preeclampsia. She presented back today with abdominal and back pain and also reported visual floaters when arrived in triage. She was diagnosed with preterm labor and noted to have severe range BPs needing IV Labetalol x 3 doses. Di-Di twins with A Breech and B transverse. She was counseled and consented for immediate C-secion delivery.  The risks, benefits, complications, treatment options, and expected outcomes were discussed with the patient. The patient concurred with the proposed plan, giving informed consent. identified as Desiree Miller and the procedure verified as C-Section Delivery. In OR, A Time Out was held and the above information confirmed. PCN causes rash so she received Ancef 2 gm without complications. After induction of Spinal anesthesia, the patient was draped and prepped in the usual sterile manner, foley was draining urine well.  A pfannenstiel incision was made and carried down through the subcutaneous tissue to the fascia. Fascial incision was made and extended transversely. The fascia was separated from the underlying rectus tissue superiorly and inferiorly. The peritoneum was identified and entered. Peritoneal incision was extended longitudinally. Alexis-O retractor placed. The utero-vesical peritoneal reflection was incised  transversely and the bladder flap was bluntly freed from the lower uterine segment. Lower segment was well developed and thin. A low transverse uterine incision was made.  Amniotomy of twin A noted clear fluid. Twin A Boy was in Complete Breech position and was delivered via breech extraction gradually keeping back anterior and head flexed. Boy born at 12.02 AM on 10/06/20 was handed to NICU after delayed cord clamping at 1 minute due to vigorous cry. Apgar scores of 5 at one minute and 8 at five minutes.  Twin B amniotomy noted clear amniotic fluid. He was in Transverse lie,back up, hand noted at the incisiion was moved away and lowest part was the head in right side of the uterus, external podalic rotation of baby performed while guiding head to hysterotomy and then delivered Cephalic with fundal pressure. Twin B Boy was born at 81.04 AM. Delayed cord clamping done at 1 minute and baby handed to NICU team in attendance. Apgars 9 and 9 at 1 and 5 minutes.  Cord ph was not sent. Cord blood was obtained for evaluation. The placenta was removed Intact and appeared normal. The uterine outline, tubes and ovaries appeared normal. The uterine incision was closed with running locked sutures of  A second imbricating layer sutured.   Hemostasis was observed. Alexis retractor removed. Peritoneal closure done with 2-0 Vicryl.  The fascia was then reapproximated with running sutures of 0Vicryl. The skin was closed with 4-0Vicryl.  All counts were correct prior the abdominal closure and were correct at the conclusion of the case. Steristrips, honeycomb and pressure dressings placed.   Findings: Twin A, delivered at 12.02 am Complete Breech extraction from complete breech position. Apgars 5 and 8  Twin B, delivered at 12.04 am from Transverse lie back up, delivered cephalic. Apgars 9 and 9. Both stable, went to NICU for prematurity. Normal uterus, tubes, ovaries.    Estimated Blood Loss: 387 mL   Total IV Fluids:  1000 ml LR    Urine Output: 100CC OF clear urine  Specimens: cord blood and placentas  Complications: no complications  Disposition: PACU - hemodynamically stable.   Maternal Condition: stable   Baby condition / location:  Couplet care / Skin to Skin  Attending Attestation: I performed the procedure.   Signed: Surgeon(s): Shea Evans, MD

## 2020-10-06 NOTE — Progress Notes (Signed)
Subjective: POD #0  Postop emerg C/s for twins in labor. Severe preeclampsia by BP criteria  Severe postpartum hemorrhage at 3.30 AM about 3hrs post-op-  total EBL 1800 cc --> OR 400 cc + PACU and PP room 1400 cc  Objective: Vital signs in last 24 hours: Temp:  [97.9 F (36.6 C)-99.8 F (37.7 C)] 98.4 F (36.9 C) (03/06 0230) Pulse Rate:  [62-108] 70 (03/06 0400) Resp:  [8-19] 15 (03/06 0251) BP: (91-163)/(52-99) 148/96 (03/06 0400) SpO2:  [89 %-100 %] 98 % (03/06 0400) Weight:  [86.3 kg] 86.3 kg (03/05 2002) Weight change:     Intake/Output from previous day: 03/05 0701 - 03/06 0700 In: 3479.7 [P.O.:480; I.V.:1864.7; IV Piggyback:1100] Out: 2284 [Urine:410; Blood:1874] Intake/Output this shift: Total I/O In: 220 [P.O.:120; I.V.:100] Out: 175 [Urine:175]  General appearance: alert and cooperative Resp: clear to auscultation bilaterally Cardio: regular rate and rhythm, S1, S2 normal, no murmur, click, rub or gallop GI: soft, non-tender; bowel sounds normal; no masses,  no organomegaly Extremities: extremities normal, atraumatic, no cyanosis or edema and Homans sign is negative, no sign of DVT Pulses: 2+ and symmetric Incision/Wound: Lochia minimal this morning  Uterus 1 cm below U DTR +2/+2   Lab Results: B pos Rub Imm   Recent Labs    10/05/20 2051 10/06/20 0301 10/06/20 0356  WBC 11.9*  --  14.4*  HGB 9.6*  --  8.1*  HCT 27.5*  --  23.8*  PLT 165 146* 145*   CMP Latest Ref Rng & Units 10/05/2020 10/03/2020 10/02/2020  Glucose 70 - 99 mg/dL 73 725(D) 94  BUN 6 - 20 mg/dL 9 10 9   Creatinine 0.44 - 1.00 mg/dL 6.64 4.03  Sodium 135 - 145 mmol/L 135 134(L) 136  Potassium 3.5 - 5.1 mmol/L 4.1 4.4 4.3  Chloride 98 - 111 mmol/L 106 105 106  CO2 22 - 32 mmol/L 20(L) 19(L) 21(L)  Calcium 8.9 - 10.3 mg/dL 9.1 4.74) 8.9  Total Protein 6.5 - 8.1 g/dL 5.9(L) 5.4(L) 6.0(L)  Total Bilirubin 0.3 - 1.2 mg/dL 0.8 0.4 0.3  Alkaline Phos 38 - 126 U/L 157(H) 135(H) 162(H)   AST 15 - 41 U/L 30 23 28   ALT 0 - 44 U/L 28 29 37      Assessment/Plan: POD #0 1) Severe Preeclampsia- will increase magnesium infusion back to 2 gm/ hr and stop at 24 hours at midnight. D/c foley but continue strict I/O, daily weights  2) Postpartum hemorrhage from uterine atony- Improved 3) Acute on chronic anemia- Add IV Venofer today, hold blood transfusion unless symptomatic when ambulates, repeat CBC tomorrow  4) Anxiety- contin Zoloft 25 mg  5) Post-op and PP care -pain mngmt, Lactation assistance  6) Twins born at 33.4 wks- Boys, stable in NICU 4.7 lbs and 4 lbs.   LOS: 1 day   2.5(Z 10/06/2020, 10:16 AM Patient ID: Desiree Miller, female   DOB: Jun 10, 1991, 30 y.o.   MRN: 08/26/1990

## 2020-10-07 ENCOUNTER — Encounter (HOSPITAL_COMMUNITY): Payer: Self-pay | Admitting: Obstetrics & Gynecology

## 2020-10-07 DIAGNOSIS — D62 Acute posthemorrhagic anemia: Secondary | ICD-10-CM | POA: Diagnosis not present

## 2020-10-07 DIAGNOSIS — Z98891 History of uterine scar from previous surgery: Secondary | ICD-10-CM

## 2020-10-07 DIAGNOSIS — O1414 Severe pre-eclampsia complicating childbirth: Secondary | ICD-10-CM | POA: Diagnosis present

## 2020-10-07 LAB — COMPREHENSIVE METABOLIC PANEL
ALT: 24 U/L (ref 0–44)
AST: 27 U/L (ref 15–41)
Albumin: 1.9 g/dL — ABNORMAL LOW (ref 3.5–5.0)
Alkaline Phosphatase: 105 U/L (ref 38–126)
Anion gap: 8 (ref 5–15)
BUN: 11 mg/dL (ref 6–20)
CO2: 23 mmol/L (ref 22–32)
Calcium: 6.8 mg/dL — ABNORMAL LOW (ref 8.9–10.3)
Chloride: 101 mmol/L (ref 98–111)
Creatinine, Ser: 0.75 mg/dL (ref 0.44–1.00)
GFR, Estimated: >60 mL/min (ref >60)
Glucose, Bld: 79 mg/dL (ref 70–99)
Potassium: 4.2 mmol/L (ref 3.5–5.1)
Sodium: 132 mmol/L — ABNORMAL LOW (ref 135–145)
Total Bilirubin: 0.2 mg/dL — ABNORMAL LOW (ref 0.3–1.2)
Total Protein: 4.3 g/dL — ABNORMAL LOW (ref 6.5–8.1)

## 2020-10-07 LAB — CBC
HCT: 18.6 % — ABNORMAL LOW (ref 36.0–46.0)
HCT: 26.6 % — ABNORMAL LOW (ref 36.0–46.0)
Hemoglobin: 6.2 g/dL — CL (ref 12.0–15.0)
Hemoglobin: 9 g/dL — ABNORMAL LOW (ref 12.0–15.0)
MCH: 33.5 pg (ref 26.0–34.0)
MCH: 33.1 pg (ref 26.0–34.0)
MCHC: 33.3 g/dL (ref 30.0–36.0)
MCHC: 33.8 g/dL (ref 30.0–36.0)
MCV: 100.5 fL — ABNORMAL HIGH (ref 80.0–100.0)
MCV: 97.8 fL (ref 80.0–100.0)
Platelets: 135 10*3/uL — ABNORMAL LOW (ref 150–400)
Platelets: 179 10*3/uL (ref 150–400)
RBC: 1.85 MIL/uL — ABNORMAL LOW (ref 3.87–5.11)
RBC: 2.72 MIL/uL — ABNORMAL LOW (ref 3.87–5.11)
RDW: 13.9 % (ref 11.5–15.5)
RDW: 16.2 % — ABNORMAL HIGH (ref 11.5–15.5)
WBC: 13.8 10*3/uL — ABNORMAL HIGH (ref 4.0–10.5)
WBC: 16.2 10*3/uL — ABNORMAL HIGH (ref 4.0–10.5)
nRBC: 0.4 % — ABNORMAL HIGH (ref 0.0–0.2)
nRBC: 0.5 % — ABNORMAL HIGH (ref 0.0–0.2)

## 2020-10-07 LAB — PREPARE RBC (CROSSMATCH)

## 2020-10-07 MED ORDER — MENTHOL 3 MG MT LOZG: 1.0000 | LOZENGE | OROMUCOSAL | Status: DC | PRN

## 2020-10-07 MED ORDER — PRENATAL MULTIVITAMIN CH: 1.0000 | ORAL_TABLET | Freq: Every day | ORAL | Status: DC

## 2020-10-07 MED ORDER — TETANUS-DIPHTH-ACELL PERTUSSIS 5-2.5-18.5 LF-MCG/0.5 IM SUSY: 0.5000 mL | PREFILLED_SYRINGE | Freq: Once | INTRAMUSCULAR | Status: DC

## 2020-10-07 MED ORDER — ALPRAZOLAM 0.5 MG PO TABS
0.5000 mg | ORAL_TABLET | Freq: Three times a day (TID) | ORAL | Status: DC | PRN
  Administered 2020-10-07 – 2020-10-08 (×2): 0.5 mg via ORAL
  Filled 2020-10-07 (×2): qty 1

## 2020-10-07 MED ORDER — IBUPROFEN 600 MG PO TABS
600.0000 mg | ORAL_TABLET | Freq: Four times a day (QID) | ORAL | Status: DC | PRN
  Administered 2020-10-08 – 2020-10-09 (×3): 600 mg via ORAL
  Filled 2020-10-07 (×3): qty 1

## 2020-10-07 MED ORDER — KETOROLAC TROMETHAMINE 30 MG/ML IJ SOLN
30.0000 mg | Freq: Four times a day (QID) | INTRAMUSCULAR | Status: AC
  Administered 2020-10-07 (×3): 30 mg via INTRAVENOUS
  Filled 2020-10-07 (×3): qty 1

## 2020-10-07 MED ORDER — OXYTOCIN-SODIUM CHLORIDE 30-0.9 UT/500ML-% IV SOLN: 2.5000 [IU]/h | INTRAVENOUS | Status: AC

## 2020-10-07 MED ORDER — DIBUCAINE (PERIANAL) 1 % EX OINT: 1.0000 "application " | TOPICAL_OINTMENT | CUTANEOUS | Status: DC | PRN

## 2020-10-07 MED ORDER — OXYCODONE HCL 5 MG PO TABS
10.0000 mg | ORAL_TABLET | ORAL | Status: DC | PRN
  Administered 2020-10-07 – 2020-10-09 (×5): 10 mg via ORAL
  Filled 2020-10-07 (×5): qty 2

## 2020-10-07 MED ORDER — ACETAMINOPHEN 325 MG PO TABS
650.0000 mg | ORAL_TABLET | ORAL | Status: DC | PRN
  Administered 2020-10-09: 650 mg via ORAL
  Filled 2020-10-07 (×2): qty 2

## 2020-10-07 MED ORDER — SERTRALINE HCL 50 MG PO TABS
25.0000 mg | ORAL_TABLET | Freq: Every day | ORAL | Status: DC
  Administered 2020-10-07: 25 mg via ORAL
  Filled 2020-10-07: qty 1

## 2020-10-07 MED ORDER — DIPHENHYDRAMINE HCL 25 MG PO CAPS: 25.0000 mg | ORAL_CAPSULE | Freq: Four times a day (QID) | ORAL | Status: DC | PRN

## 2020-10-07 MED ORDER — WITCH HAZEL-GLYCERIN EX PADS: 1.0000 "application " | MEDICATED_PAD | CUTANEOUS | Status: DC | PRN

## 2020-10-07 MED ORDER — SODIUM CHLORIDE 0.9% IV SOLUTION: Freq: Once | INTRAVENOUS | Status: AC

## 2020-10-07 MED ORDER — FAMOTIDINE 20 MG PO TABS
20.0000 mg | ORAL_TABLET | Freq: Two times a day (BID) | ORAL | Status: DC
  Administered 2020-10-07 – 2020-10-10 (×7): 20 mg via ORAL
  Filled 2020-10-07 (×7): qty 1

## 2020-10-07 MED ORDER — SIMETHICONE 80 MG PO CHEW
80.0000 mg | CHEWABLE_TABLET | Freq: Three times a day (TID) | ORAL | Status: DC
  Administered 2020-10-07 – 2020-10-10 (×10): 80 mg via ORAL
  Filled 2020-10-07 (×10): qty 1

## 2020-10-07 MED ORDER — FERROUS SULFATE 325 (65 FE) MG PO TABS
325.0000 mg | ORAL_TABLET | ORAL | Status: DC
  Administered 2020-10-07: 325 mg via ORAL
  Filled 2020-10-07: qty 1

## 2020-10-07 MED ORDER — LABETALOL HCL 100 MG PO TABS
100.0000 mg | ORAL_TABLET | Freq: Two times a day (BID) | ORAL | Status: DC
Start: 2020-10-07 — End: 2020-10-07

## 2020-10-07 MED ORDER — COCONUT OIL OIL
1.0000 "application " | TOPICAL_OIL | Status: DC | PRN
  Administered 2020-10-08: 1 via TOPICAL

## 2020-10-07 MED ORDER — SIMETHICONE 80 MG PO CHEW: 80.0000 mg | CHEWABLE_TABLET | ORAL | Status: DC | PRN

## 2020-10-07 MED ORDER — DIPHENHYDRAMINE HCL 50 MG/ML IJ SOLN
25.0000 mg | Freq: Once | INTRAMUSCULAR | Status: AC
  Administered 2020-10-07: 25 mg via INTRAVENOUS
  Filled 2020-10-07: qty 1

## 2020-10-07 NOTE — Progress Notes (Signed)
Subjective: Postpartum Day 1: Cesarean Delivery @ 33.5 DCDA complicated by preE w/ SF and PPH  Patient reports feeling very tired and fatigued. Pain improved with pain medications and well managed at rest. Did report some dizziness with ambulation this morning. Denies any CP, SOB. Reports some blurred vision but attributes this to minimal sleep, denies HA. Voiding spontaneously without difficulties. Tolerating PO denies N/V. VB has been WNL. Currently working on pumping.  Baby boys 'Reyne Dumas' and '[name]' are doing well in the NICU.  Objective: Patient Vitals for the past 24 hrs:  BP Temp Temp src Pulse Resp SpO2 Weight  10/07/20 0625 -- -- -- -- -- -- 82.4 kg  10/07/20 0358 129/77 98.2 F (36.8 C) Oral 79 18 98 % --  10/07/20 0010 122/79 -- -- 82 20 -- --  10/07/20 0005 129/86 -- -- 83 20 -- --  10/06/20 2359 132/87 98.1 F (36.7 C) Oral 81 18 99 % --  10/06/20 2350 122/85 -- -- 77 18 96 % --  10/06/20 2000 122/77 98.4 F (36.9 C) Oral 69 16 97 % --  10/06/20 1900 117/67 -- -- 70 -- 96 % --  10/06/20 1859 -- -- -- -- 15 -- --  10/06/20 1812 -- 98 F (36.7 C) Oral -- -- -- --  10/06/20 1758 -- -- -- -- 16 -- --  10/06/20 1700 115/66 -- -- 73 16 -- --  10/06/20 1630 -- -- -- -- 14 -- --  10/06/20 1600 (!) 109/58 -- -- -- -- -- --  10/06/20 1500 -- -- -- -- 16 98 % --  10/06/20 1400 (!) 112/56 -- -- -- 14 97 % --  10/06/20 1300 -- -- -- -- 15 96 % --  10/06/20 1250 -- -- -- -- -- 92 % --  10/06/20 1230 -- -- -- -- -- 95 % --  10/06/20 1200 124/75 -- -- 76 -- 97 % --  10/06/20 1150 -- -- -- -- -- 97 % --  10/06/20 1146 -- (!) 97.4 F (36.3 C) Oral -- -- -- --  10/06/20 1145 -- -- -- -- -- 95 % --  10/06/20 1140 -- -- -- -- -- 96 % --  10/06/20 1135 -- -- -- -- -- 93 % --  10/06/20 1130 -- -- -- -- -- 98 % --  10/06/20 1125 -- -- -- -- -- 98 % --  10/06/20 1120 -- -- -- -- -- 99 % --  10/06/20 1115 -- -- -- -- -- 99 % --  10/06/20 1110 -- -- -- -- -- 100 % --  10/06/20 1105  -- -- -- -- -- 99 % --  10/06/20 1100 136/87 -- -- 75 -- 100 % --  10/06/20 1055 -- -- -- -- -- 100 % --  10/06/20 1050 -- -- -- -- -- 99 % --  10/06/20 1045 -- -- -- -- -- 100 % --  10/06/20 1040 -- -- -- -- -- 100 % --  10/06/20 1035 -- -- -- -- -- 99 % --  10/06/20 1030 -- -- -- -- -- 99 % --  10/06/20 1025 -- -- -- -- -- 96 % --  10/06/20 1020 -- -- -- -- -- 96 % --  10/06/20 1015 -- -- -- -- -- 95 % --  10/06/20 1010 -- -- -- -- -- 95 % --  10/06/20 1005 -- -- -- -- -- 96 % --  10/06/20 1001 122/76 -- -- 63 -- -- --  10/06/20 1000  122/76 -- -- 63 -- 96 % --  10/06/20 0930 -- -- -- -- -- 97 % --  10/06/20 0900 129/81 -- -- 62 -- 98 % --  10/06/20 0830 -- -- -- -- -- 97 % --    Intake/Output Summary (Last 24 hours) at 10/07/2020 0817 Last data filed at 10/07/2020 0816 Gross per 24 hour  Intake 2304.87 ml  Output 3600 ml  Net -1295.13 ml     Physical Exam:  General: alert, no distress and pale  Heart: RRR, no audible murmurs Lungs: CTABL: no rales no crackles Abdomen: minimal distension, soft, minimal tenderness to palpation, no rebound tenderness, no guarding, no rigidity Lochia: appropriate Uterine Fundus: firm Incision: no significant drainage on pressure bandage DVT Evaluation: No evidence of DVT seen on physical exam.  Recent Labs    10/06/20 0356 10/07/20 0455  HGB 8.1* 6.2*  HCT 23.8* 18.6*    Assessment/Plan: Status post Cesarean section. Postoperative course complicated by preE w/ SF s/p 24hr PP Mag, and PPH   Continue current care. Desiree Miller [id number] POD#1 sp pLTCS at [redacted]w[redacted]d for DCDA twin pregnancy presenting in PTL with preE with SF  1. PPC: cont current pain regimen per ERAS protocol, advance regular diet as tolerated 2. PPH  -estimated 1800 cc s/p Hemabate, TXA, Misoprostol, and additional Pitocin. Lochia now stable for 24hrs, POD1 Hgb 6.2 down from 4hr postop Hgb of 8.1 likely representing equilibration. Patient vitals stable, urine output  appropriate, and benign abdominal exam. We discussed risks/benefits of blood transfusion for symptomatic acute blood loss anemia with Hgb < 7. Risks of hypersensitivity reaction and infectious risks including HIV, Hepatitis B, and Hepatitis C reviewed. Patient understands risks and agrees to proceed with blood transfusion. 2U PRBC's ordered, plan to check post-transfusion CBC 3. preE with SF -PIH labs on admission showed mild thrombocytopenia plts of 165, otherwise WNL. Plts this morning stable at 135 -s/p 24hr of PP Magnesium -Review of BP's since delivery mostly 110-120s/60-80s with PO Labetalol 100mg  TID. Will d/c labetalol for now until transfusion complete. Discussed with pt likely will need to restart antihypertensives pending BP's 4. SCD VTE ppx 5. Lactation consult PRN 6. RH POS, Rubella Immune 7. Routine postpartum and postop care. Continue inpatient management  Desiree Miller A Desiree Miller 10/07/2020, 8:12 AM

## 2020-10-07 NOTE — Clinical Social Work Maternal (Signed)
CLINICAL SOCIAL WORK MATERNAL/CHILD NOTE  Patient Details  Name: Jeni Salles MRN: [id number] Date of Birth: 09/03/1990  Date:  03-12-2021  Clinical Social Worker Initiating Note:  Laurey Arrow Date/Time: Initiated:  10/07/20/1353     Child's Name:  Christie Nottingham Marley   Biological Parents:  Mother,Father   Need for Interpreter:  None   Reason for Referral:  Behavioral Health Concerns (hx of anxiety)   Address:  [place] [address]    Phone number:  [phone] (home)     Additional phone number: FOB's number is [phone]  Household Members/Support Persons (HM/SP):   Household Member/Support Person 1   HM/SP Name Relationship DOB or Age  HM/SP -1 Lupita Dawn Carrithers FOB 07/12/1989  HM/SP -2        HM/SP -3        HM/SP -4        HM/SP -5        HM/SP -6        HM/SP -7        HM/SP -8          Natural Supports (not living in the home):  Extended Family,Parent,Immediate Family (Per the couple, FOB's family will also provide supports if needed.)   Professional Supports: None   Employment: Animator   Type of Work: Scientist, clinical (histocompatibility and immunogenetics)   Education:  [place] arranged:    Museum/gallery curator Resources:  Multimedia programmer   Other Resources:      Cultural/Religious Considerations Which May Impact Care:  Per [employer], MOB is Engineer, manufacturing.   Strengths:  Ability to meet basic needs ,Pediatrician chosen,Home prepared for child ,Compliance with medical plan ,Understanding of illness,Psychotropic Medications   Psychotropic Medications:  Zoloft      Pediatrician:    [place] area  Pediatrician List:   [place]      Pediatrician Fax Number:    Risk Factors/Current Problems:  Mental Health Concerns    Cognitive State:  Alert ,Insightful ,Linear Thinking     Mood/Affect:  Happy ,Calm ,Interested ,Bright ,Relaxed    CSW Assessment:  CSW met with MOB and FOB at MOB's bedside in room 114. When CSW arrived, MOB was resting in bed and FOB was resting on the couch. The couple appeared happy, comfortable, and supportive of each other. CSW introduced herself and explained CSW's role. MOB gave CSW permission to complete assessment while FOB was present. MOB was polite, easy to engage, and was receptive to meeting with CSW.  CSW inquired about MOB's MH and MOB acknowledged a hx of anxiety and reported that she was dx around 2012. MOB shared that symptoms are currently being managed by Zoloft. CSW educated MOB about PMADs. CSW informed MOB of possible supports and interventions to decrease PPD.  CSW also encouraged MOB to seek medical attention if needed for increased signs and symptoms of PPD.  CSW also offered MOB resources for outpatient behavioral health services and MOB declined. CSW encouraged MOB to evaluate her mental health throughout the postpartum period with the use of the New Mom Checklist developed by Postpartum Progress and notify a medical professional if symptoms arise; MOB agreed. MOB presented with insight and awareness and denied SI and HI when assessed for safety. MOB reported having a good support team that will be willing to help if needed.  CSW reviewed safe sleep and SIDS. MOB and FOB were knowledgeable and asked appropriate questions. MOB communicated that MOB has everything she needs for the twins and is prepared to meet their needs.  MOB did not have any further questions, concerns, or needs currently. CSW thanked MOB for allowing CSW to meet with her. CSW will continue to offer resources and supports to family while twins remains in NICU.  There are no barriers to discharge.  CSW Plan/Description:  Psychosocial Support and Ongoing Assessment of Needs,Sudden Infant Death Syndrome (SIDS) Education,Perinatal Mood and Anxiety Disorder  (PMADs) Education,Other Patient/Family Education,Other Information/Referral to [employer], MSW, [employer] Social Work [phone]  Dimple Nanas, [place] 10/07/2020, 1:58 PM

## 2020-10-07 NOTE — Lactation Note (Signed)
This note was copied from a baby's chart. Lactation Consultation Note  Patient Name: Desiree Miller [id number] Date: 10/07/2020 Reason for consult: Follow-up assessment;1st time breastfeeding;Primapara;Multiple gestation;Infant < 6lbs;NICU baby;Preterm <34wks;Other (Comment) (mom S/P MagSo4, Hgb6.2 and transfusion planned for today,) Age:31 hours  LC visited mom in her room 114. She had recently pumped off 7 ml.  LC praised her for efforts.  When able to visit the babies in NICU - consider taking her pump pieces with her and pumping in front of the babies to increase let down.  Ask when in NICU when she will be able to do STS with the babies.  Mom aware of the [facility] plan on the dry eraser board in the room. LC reviewed with mom.  LC introduced the idea when mom is feeling up to it to add a power pump x 2 per day.    Maternal Data    Feeding Mother's Current Feeding Choice: Breast Milk  LATCH Score                    Lactation Tools Discussed/Used Tools: Pump Flange Size: 24 (per mom comfortable with #24 F) Breast pump type: Double-Electric Breast Pump Pumped volume: 7 mL  Interventions    Discharge    Consult Status Consult Status: Follow-up Date: 10/08/20 Follow-up type: In-patient    [name] 10/07/2020, 8:55 AM

## 2020-10-08 DIAGNOSIS — F419 Anxiety disorder, unspecified: Secondary | ICD-10-CM | POA: Diagnosis present

## 2020-10-08 LAB — TYPE AND SCREEN
ABO/RH(D): B POS
Antibody Screen: NEGATIVE
Unit division: 0
Unit division: 0

## 2020-10-08 LAB — BPAM RBC
Blood Product Expiration Date: 202203242359
Blood Product Expiration Date: 202203302359
ISSUE DATE / TIME: 202203070938
ISSUE DATE / TIME: 202203071241
Unit Type and Rh: 7300
Unit Type and Rh: 7300

## 2020-10-08 LAB — SURGICAL PATHOLOGY

## 2020-10-08 MED ORDER — MAGNESIUM OXIDE 400 (241.3 MG) MG PO TABS
400.0000 mg | ORAL_TABLET | Freq: Every day | ORAL | Status: DC
  Administered 2020-10-08 – 2020-10-10 (×3): 400 mg via ORAL
  Filled 2020-10-08 (×3): qty 1

## 2020-10-08 MED ORDER — NIFEDIPINE ER OSMOTIC RELEASE 30 MG PO TB24
30.0000 mg | ORAL_TABLET | Freq: Every day | ORAL | Status: DC
  Administered 2020-10-08 – 2020-10-09 (×2): 30 mg via ORAL
  Filled 2020-10-08 (×3): qty 1

## 2020-10-08 MED ORDER — SERTRALINE HCL 50 MG PO TABS
50.0000 mg | ORAL_TABLET | Freq: Every day | ORAL | Status: DC
  Administered 2020-10-09 (×2): 50 mg via ORAL
  Filled 2020-10-08 (×2): qty 1

## 2020-10-08 MED ORDER — POLYSACCHARIDE IRON COMPLEX 150 MG PO CAPS
150.0000 mg | ORAL_CAPSULE | Freq: Every day | ORAL | Status: DC
  Administered 2020-10-08 – 2020-10-10 (×3): 150 mg via ORAL
  Filled 2020-10-08 (×3): qty 1

## 2020-10-08 NOTE — Plan of Care (Signed)
  Problem: Clinical Measurements: Goal: Ability to maintain clinical measurements within normal limits will improve Outcome: Progressing Goal: Diagnostic test results will improve Outcome: Progressing   Problem: Activity: Goal: Risk for activity intolerance will decrease Outcome: Adequate for Discharge   Problem: Elimination: Goal: Will not experience complications related to bowel motility Outcome: Adequate for Discharge   Problem: Pain Managment: Goal: General experience of comfort will improve Outcome: Progressing   Problem: Safety: Goal: Ability to remain free from injury will improve Outcome: Progressing   Problem: Education: Goal: Knowledge of condition will improve Outcome: Progressing   Problem: Life Cycle: Goal: Chance of risk for complications during the postpartum period will decrease Outcome: Progressing   Problem: Role Relationship: Goal: Ability to demonstrate positive interaction with newborn will improve Outcome: Progressing   Problem: Clinical Measurements: Goal: Complications related to disease process, condition or treatment will be avoided or minimized Outcome: Progressing

## 2020-10-08 NOTE — Lactation Note (Signed)
This note was copied from a baby's chart. Lactation Consultation Note  Patient Name: Desiree Miller [id number] Date: 10/08/2020 Reason for consult: Follow-up assessment;Mother's request;Preterm <34wks Age:31 hours  LC called by Mom to assess flange size. RN provided coconut oil for nipple care. No abrasions noted at this time.  LC tried 27 flange then 24 flange. 24 was a better fit. Mom understands breasts can swell with pumping. If nipples become tight during pumping session, Mom can increase to larger flange of 27 provided.  All questions answered at the end of the visit.  Mom aware to call RN to transport any EBM to NICU.   Maternal Data    Feeding    LATCH Score                    Lactation Tools Discussed/Used Tools: Flanges Flange Size: 24 Breast pump type: Double-Electric Breast Pump Reason for Pumping: every 3 hrs for 15 minutes Pumping frequency: every 3 hrs for 15 minutes  Interventions Interventions: Breast feeding basics reviewed;DEBP;Education  Discharge Pump: Personal WIC Program: No  Consult Status Consult Status: Follow-up Date: 10/09/20 Follow-up type: In-patient    Desiree Miller  [name] 10/08/2020, 5:40 PM

## 2020-10-08 NOTE — Progress Notes (Addendum)
Subjective:  POD# 2 1LTCS PTD 33.5. PEC/Mag24hrs   Kerina, [place] [[id number]]   Live born female  Birth Weight: 4 lb 7.3 oz (2020 g) APGAR: 5, 8  Newborn Delivery   Birth date/time: 10/06/2020 00:02:00 Delivery type: C-Section, Low Transverse Trial of labor: No C-section categorization: Primary       Pheonix, Wisby [[id number]]  Live born female  Birth Weight: 4 lb 0.2 oz (1820 g) APGAR: 9, 9  Newborn Delivery   Birth date/time: 10/06/2021 00:04:00 Delivery type: C-Section, Low Transverse Trial of labor: No C-section categorization: Primary     Babies name: Charlann Noss   Delivering provider:    Valetta, Mulroy [[id number]]  MODY, Corin, Formisano [[id number]]  MODY, VAISHALI    circumcision deferred until day of d/c.  Feeding: breast, with Lactation support.  Pain control at delivery:    Mayrani, Khamis [[id number]]  Spinal    Justin, Buechner [[id number]]  Spinal    Reports feeling well. Sore with movement.  Patient reports tolerating PO.   Breast symptoms: none Pain controlled with acetaminophen Denies HA/SOB/C/P/N/V/dizziness. Flatus present. First BM 10/08/20. She reports vaginal bleeding as normal, without clots.  She is ambulating, urinating without difficulty.     Objective:   VS:    Vitals:   10/08/20 0440 10/08/20 0441 10/08/20 0459 10/08/20 0500  BP:  (!) 151/87    Pulse:  75    Resp:  18    Temp:  99.3 F (37.4 C)    TempSrc:  Oral    SpO2: 96% 100%    Weight:   81.9 kg 82.2 kg      Intake/Output Summary (Last 24 hours) at 10/08/2020 0935 Last data filed at 10/08/2020 0816 Gross per 24 hour  Intake 2587.5 ml  Output 2500 ml  Net 87.5 ml        Recent Labs    10/07/20 0455 10/07/20 1948  WBC 13.8* 16.2*  HGB 6.2* 9.0*  HCT 18.6* 26.6*  PLT 135* 179   Hepatic Function Latest Ref Rng & Units 10/07/2020 10/05/2020 10/03/2020  Total Protein 6.5 - 8.1 g/dL 4.3(L) 5.9(L) 5.4(L)  Albumin 3.5 - 5.0 g/dL 1.9(L)  2.7(L) 2.4(L)  AST 15 - 41 U/L 27 30 23   ALT 0 - 44 U/L 24 28 29   Alk Phosphatase 38 - 126 U/L 105 157(H) 135(H)  Total Bilirubin 0.3 - 1.2 mg/dL 0.2(L) 0.8 0.4    Blood type: --/--/B POS (03/05 2130)  Rubella:   immune Vaccines: TDaP          UTD          Flu             UTD                    COVID-19 UTD   Physical Exam:  General: alert and cooperative Resp: regular and unlabored  Abdomen: soft non-tender to palpation.  Incision: covered by dressing. Pressure dressing intact. Honeycomb underneath clean dry and intact, minimal bleeding noted.  Uterine Fundus: firm, below umbilicus, tender Lochia: minimal Ext: edema mild pitting on R foot. And non-pitting on L.      Assessment/Plan: 30 y.o.   POD# 2. [id number]                  Principal Problem:   -Postpartum care following cesarean delivery 3/5  - stable post-op  - encouraged ambulation to increase gut motility  and relieve soreness   Preterm labor in third trimester with preterm delivery  -Infants in NICU doing well   Preeclampsia, severe, third trimester  - s/p Mag 24 hrs w/ good diuresis  - labs stable, no neural sx  - mild range BP overnight with anxiety episode, started on Procardia 30 XL  - continue to monitor closely inpatient  - encourage rest when not at NICU bedside   Twin pregnancy, twins dichorionic and diamniotic   Status post primary low transverse cesarean section 3/5   -Postpartum hemorrhage - 1874cc    Anemia associated with acute blood loss  -- s/p 2 units PRBC transfused, hgb improved to 9.0 from nadir of 6.2  - started oral Fe and Mag ox to continue 4-6 wks PP   -Anxiety disorder  - hx of anxiety on Zoloft 25 mg  - Panic attack last night covered with 1/2 tab xanax with good effect.  -  Discussed and recommended increase Zoloft to 76m daily. Pt agreed that would be beneficial. Also dicussed Ambien 5 mg PRN at night as that is when she is most anxious and PRN coverage of buspirone 7.5 mg q 6 hrs PRN  during the day to avoid daytime drowsiness.   - Encouraged increase in PO intake of fluids, rest and relaxation.      Doing well, stable.  FOB and grandparent at bedside.             Breastfeeding support-LC to return to assist with pumping, with correct size flanges.   Routine post-op care Anticipate DC in AM  Shantonette MEston Mould SNM 10/08/2020, 9:35 AM   Medical screening examination/treatment/procedure(s) were conducted as a shared visit with non-physician practitioner(s) and myself.  I personally evaluated the patient during the encounter.   DJuliene Pina CNM, MSN 10/08/2020, 11:45 AM

## 2020-10-09 DIAGNOSIS — R609 Edema, unspecified: Secondary | ICD-10-CM | POA: Diagnosis not present

## 2020-10-09 LAB — COMPREHENSIVE METABOLIC PANEL
ALT: 32 U/L (ref 0–44)
AST: 38 U/L (ref 15–41)
Albumin: 2.3 g/dL — ABNORMAL LOW (ref 3.5–5.0)
Alkaline Phosphatase: 122 U/L (ref 38–126)
Anion gap: 8 (ref 5–15)
BUN: 12 mg/dL (ref 6–20)
CO2: 27 mmol/L (ref 22–32)
Calcium: 8.9 mg/dL (ref 8.9–10.3)
Chloride: 102 mmol/L (ref 98–111)
Creatinine, Ser: 0.72 mg/dL (ref 0.44–1.00)
GFR, Estimated: >60 mL/min (ref >60)
Glucose, Bld: 86 mg/dL (ref 70–99)
Potassium: 5 mmol/L (ref 3.5–5.1)
Sodium: 137 mmol/L (ref 135–145)
Total Bilirubin: 0.4 mg/dL (ref 0.3–1.2)
Total Protein: 5.1 g/dL — ABNORMAL LOW (ref 6.5–8.1)

## 2020-10-09 MED ORDER — NIFEDIPINE ER OSMOTIC RELEASE 30 MG PO TB24
30.0000 mg | ORAL_TABLET | Freq: Once | ORAL | Status: AC
  Administered 2020-10-09: 30 mg via ORAL
  Filled 2020-10-09: qty 1

## 2020-10-09 MED ORDER — NIFEDIPINE ER OSMOTIC RELEASE 30 MG PO TB24
60.0000 mg | ORAL_TABLET | Freq: Every day | ORAL | Status: DC
  Administered 2020-10-10: 60 mg via ORAL
  Filled 2020-10-09: qty 2

## 2020-10-09 NOTE — Lactation Note (Signed)
This note was copied from a baby's chart. Lactation Consultation Note  Patient Name: Desiree Miller [id number] Date: 10/09/2020 Reason for consult: Follow-up assessment;NICU baby;Multiple gestation Age:31 days  Maternal Data  Mom pumping during visit with increasing volume. No engorgement at this time. Provided additional storage containers. Reviewed LC services in NICU.  Feeding Mother's Current Feeding Choice: Breast Milk and Donor Milk  Interventions Interventions: Education;Expressed milk (IDF)  Discharge Discharge Education: Engorgement and breast care  Consult Status Consult Status: Follow-up Follow-up type: In-patient   Elder Negus, MA IBCLC 10/09/2020, 9:47 AM

## 2020-10-09 NOTE — Progress Notes (Signed)
CSW met with MOB and FOB at twins bedside in room 350. When CSW arrived, MOB was holding one twin and the other twin was resting in his isolette. FOB was also present and he was sitting in the recliner observing MOB and TwinA interactions. The family appeared happy and comfortable. CSW asked about MOB's thoughts and feelings about discharging today.  MOB acknowledged increase anxiety and reported "The closer I get to may discharge the more anxious I've been getting." CSW validated and normalized MOB's thoughts and feelings and reminded MOB that she will have 24/7 access with her twins in the NICU. CSW reviewed MOB's Flavia Shipper and talked about various interventions to reduce MOB's anxiety.  Per MOB, MOB has spoken with her provider and her provider will give MOB a Rx for an increase dose Zoloft. MOB communicated, "I think the increase dose will help.  CSW will continue to offer resources and supports to family while infant remains in NICU.    Laurey Arrow, MSW, LCSW Clinical Social Work [phone]

## 2020-10-10 ENCOUNTER — Encounter (HOSPITAL_COMMUNITY): Payer: Self-pay | Admitting: Obstetrics & Gynecology

## 2020-10-10 MED ORDER — HYDROCHLOROTHIAZIDE 12.5 MG PO CAPS: 12.5000 mg | ORAL_CAPSULE | Freq: Every day | ORAL | 0 refills | Status: DC

## 2020-10-10 MED ORDER — ACETAMINOPHEN 500 MG PO TABS: 1000.0000 mg | ORAL_TABLET | Freq: Four times a day (QID) | ORAL | 2 refills | Status: AC | PRN

## 2020-10-10 MED ORDER — IBUPROFEN 600 MG PO TABS: 600.0000 mg | ORAL_TABLET | Freq: Four times a day (QID) | ORAL | 0 refills | Status: DC

## 2020-10-10 MED ORDER — HYDROCHLOROTHIAZIDE 12.5 MG PO CAPS
12.5000 mg | ORAL_CAPSULE | Freq: Every day | ORAL | Status: DC
  Administered 2020-10-10: 12.5 mg via ORAL
  Filled 2020-10-10: qty 1

## 2020-10-10 MED ORDER — OXYCODONE HCL 10 MG PO TABS: 10.0000 mg | ORAL_TABLET | ORAL | 0 refills | Status: DC | PRN

## 2020-10-10 MED ORDER — NIFEDIPINE ER 60 MG PO TB24: 60.0000 mg | ORAL_TABLET | Freq: Every day | ORAL | 0 refills | Status: DC

## 2020-10-10 MED ORDER — SERTRALINE HCL 50 MG PO TABS: 50.0000 mg | ORAL_TABLET | Freq: Every day | ORAL | 0 refills | Status: DC

## 2020-10-10 MED ORDER — COCONUT OIL OIL: 1.0000 "application " | TOPICAL_OIL | 0 refills | Status: DC | PRN

## 2020-10-10 MED ORDER — BUSPIRONE HCL 15 MG PO TABS
7.5000 mg | ORAL_TABLET | Freq: Four times a day (QID) | ORAL | Status: DC | PRN
  Filled 2020-10-10: qty 1

## 2020-10-10 MED ORDER — SENNOSIDES-DOCUSATE SODIUM 8.6-50 MG PO TABS: 2.0000 | ORAL_TABLET | Freq: Every day | ORAL | Status: AC

## 2020-10-10 MED ORDER — BUSPIRONE HCL 7.5 MG PO TABS: 7.5000 mg | ORAL_TABLET | Freq: Three times a day (TID) | ORAL | 0 refills | Status: DC | PRN

## 2020-10-10 MED ORDER — IBUPROFEN 600 MG PO TABS: 600.0000 mg | ORAL_TABLET | Freq: Four times a day (QID) | ORAL | Status: DC

## 2020-10-10 MED ORDER — FERROUS SULFATE 325 (65 FE) MG PO TABS: 325.0000 mg | ORAL_TABLET | Freq: Every day | ORAL | 3 refills | Status: AC

## 2020-10-10 NOTE — Progress Notes (Signed)
POSTOPERATIVE DAY # 4 S/P Primary LTCS for preterm labor, severe pre-eclampsia, Desiree Miller twins in NICU "Desiree Miller and Desiree Miller"   S:         Reports feeling okay today, more incisional pain, but hasn't taken any pain medication since last night. All pain meds have been PRN. Denies HA, visual changes, RUQ/epigastric pain. Mood stable currently, no recent panic attack, but anxious about discharge              Tolerating po intake / no nausea / no vomiting / + flatus / + BM  Denies dizziness, SOB, or CP - reports 1 episode of feeling light headed after she got out of the shower today, but states she had not eaten breakfast and after taking HCTZ.              Bleeding is light             Pain controlled with Motrin, Tylenol, Oxycodone IR             Up ad lib / ambulatory/ voiding QS  Newborns in NICU, stable per mom; mom pumping and feels like milk supply is in   O:  VS: BP (!) 148/95 (BP Location: Right Arm)   Pulse 74   Temp 98.2 F (36.8 C) (Oral)   Resp 17   Wt 75.1 kg   SpO2 100%   Breastfeeding Unknown   BMI 27.12 kg/m   Patient Vitals for the past 24 hrs:  BP Temp Temp src Pulse Resp SpO2 Weight  10/10/20 1518 (!) 148/95 98.2 F (36.8 C) Oral 74 17 100 % --  10/10/20 1253 (!) 141/88 98.2 F (36.8 C) Oral 77 18 98 % --  10/10/20 0815 (!) 142/97 98.1 F (36.7 C) Oral 70 18 98 % --  10/10/20 0453 140/80 98 F (36.7 C) Oral 74 18 98 % 75.1 kg  10/10/20 0017 (!) 140/97 98.1 F (36.7 C) Oral 80 18 98 % --  10/09/20 1946 (!) 148/94 98.4 F (36.9 C) Oral 84 18 98 % --     LABS:              Recent Labs    10/07/20 1948  WBC 16.2*  HGB 9.0*  PLT 179               Bloodtype: --/--/B POS (03/05 2130)  Rubella:        immune                                       I&O: Intake/Output      03/09 0701 03/10 0700 03/10 0701 03/11 0700   P.O.     Total Intake(mL/kg)     Urine (mL/kg/hr) 2950 (1.6)    Total Output 2950    Net -2950          TDaP          UTD                     Flu             UTD                    COVID-19 UTD            Physical Exam:             Alert  and Oriented X3  Lungs: Clear and unlabored  Heart: regular rate and rhythm / no murmurs  Abdomen: soft, non-tender, non-distended, active bowel sounds             Fundus: firm, non-tender, below umbilicus             Dressing: honeycomb with steri-strips c/d/i              Incision:  approximated with sutures / no erythema / no ecchymosis / no drainage  Perineum: intact  Lochia: small rubra on pad   Extremities: trace LE edema, no calf pain or tenderness, +2 DTRs, no clonus bilaterally   A/P:     POD # 4 S/P Primary LTCS            Severe pre-eclampsia   - Currently on Procardia 60mg  XL daily and added HCTZ 12.5mg  x 5 days    - No neural s/s    - s/p Mag 24 hrs with good diuresis    - Labile BPs, but also with anxiety component and per MD consult, cleared for discharge with close f/u on Monday. Pt. To buy a BP cuff and check BPs daily   Anxiety   - on Zoloft 50mg  daily, added Buspirone 7.5mg  PRN for anxiety, but has not taken it yet, desires rx for discharge   - PPD/anxiety warning s/s reviewed   Anemia associated with acute blood loss -- s/p IV iron x1, 2 units PRBC transfused, hgb improved to 9.0 from nadir of 6.2 - started oral Fe and Mag ox to continue 4-6 wks PP  Routine postoperative care              Discharge home; discharge instructions and warning s/s reviewed  F/u with Dr. Friday on Monday for BP check  POC in consult with Dr. Ernestina Penna and Dr. Saturday, MSN, [facility]

## 2020-10-10 NOTE — Lactation Note (Signed)
This note was copied from a baby's chart. Lactation Consultation Note  Patient Name: Desiree Miller JMEQA'[id number] Date: 10/10/2020 Reason for consult: NICU baby;Follow-up assessment;Engorgement;Multiple gestation Age:31 days  Maternal Data  Mom with breast fullness trending toward engorgement. Last pumping was last night at bedtime. Provided ice and encouraged pumping q 3 hours today. Mom is aware of LC services and will request f/u visit later today if she has breast pain or difficulty with pumping. Reviewed LC services in NICU and relayed to mom that team is available to assist with bf when infants are demonstrating readiness.   Feeding Mother's Current Feeding Choice: Breast Milk and Donor Milk   Interventions Interventions: Education;Ice  Discharge Discharge Education: Engorgement and breast care  Consult Status Consult Status: Follow-up Follow-up type: In-patient   Elder Negus, MA IBCLC 10/10/2020, 11:47 AM

## 2020-10-10 NOTE — Plan of Care (Signed)
  Problem: Health Behavior/Discharge Planning: Goal: Ability to manage health-related needs will improve Outcome: Adequate for Discharge   Problem: Clinical Measurements: Goal: Ability to maintain clinical measurements within normal limits will improve Outcome: Adequate for Discharge Goal: Will remain free from infection Outcome: Adequate for Discharge Goal: Diagnostic test results will improve Outcome: Adequate for Discharge Goal: Cardiovascular complication will be avoided Outcome: Adequate for Discharge   Problem: Activity: Goal: Risk for activity intolerance will decrease Outcome: Adequate for Discharge   Problem: Elimination: Goal: Will not experience complications related to bowel motility Outcome: Adequate for Discharge   Problem: Pain Managment: Goal: General experience of comfort will improve Outcome: Adequate for Discharge   Problem: Safety: Goal: Ability to remain free from injury will improve Outcome: Adequate for Discharge   Problem: Education: Goal: Knowledge of condition will improve Outcome: Adequate for Discharge Goal: Individualized Educational Video(s) Outcome: Adequate for Discharge Goal: Individualized Newborn Educational Video(s) Outcome: Adequate for Discharge   Problem: Coping: Goal: Ability to identify and utilize available resources and services will improve Outcome: Adequate for Discharge   Problem: Life Cycle: Goal: Chance of risk for complications during the postpartum period will decrease Outcome: Adequate for Discharge   Problem: Role Relationship: Goal: Ability to demonstrate positive interaction with newborn will improve Outcome: Adequate for Discharge   Problem: Skin Integrity: Goal: Demonstration of wound healing without infection will improve Outcome: Adequate for Discharge   Problem: Education: Goal: Knowledge of disease or condition will improve Outcome: Adequate for Discharge Goal: Knowledge of the prescribed therapeutic  regimen will improve Outcome: Adequate for Discharge   Problem: Fluid Volume: Goal: Peripheral tissue perfusion will improve Outcome: Adequate for Discharge   Problem: Clinical Measurements: Goal: Complications related to disease process, condition or treatment will be avoided or minimized Outcome: Adequate for Discharge

## 2020-10-10 NOTE — Progress Notes (Addendum)
Subjective: POD# 3   Desiree Miller, Desiree Miller [[id number]]  Live born female  Birth Weight: 4 lb 7.3 oz (2020 g) APGAR: 5, 8  Newborn Delivery   Birth date/time: 10/06/2020 00:02:00 Delivery type: C-Section, Low Transverse Trial of labor: No C-section categorization: Primary       Desiree, Miller [[id number]]  Live born female  Birth Weight: 4 lb 0.2 oz (1820 g) APGAR: 9, 9  Newborn Delivery   Birth date/time: 10/06/2020 00:04:00 Delivery type: C-Section, Low Transverse Trial of labor: No C-section categorization: Primary     Baby names: Fontaine No Delivering provider:    [name], Miller [[id number]]  MODY, Desiree, Miller [[id number]]  MODY, Desiree Miller    circumcision planned at NICU discharge Feeding: breast and bottle/pumping  Pain control at delivery:    Desiree, Miller [[id number]]  Spinal    Desiree, Miller [[id number]]  Spinal    Reports feeling better today, ambulating to NICU with ease, pain well controlled. Slept last night better with added meds for anxiety.  Denies PEC neural sx.    Objective:   VS:  10/09/20 1239 98.4 F (36.9 C) 83 18 140/86 99 % Room Air --  10/09/20 0420 -- -- -- -- 97 % -- --  10/09/20 0419 98.1 F (36.7 C) 61 18 153/79Abnormal 97 % -- 78.4 kg     Blood type: --/--/B POS (03/05 2130)  Rubella:   immune Vaccines: TDaP          UTD         Flu             UTD                    COVID-19 UTD   Physical Exam:  General: alert, cooperative and no distress Abdomen: soft, nontender, normal bowel sounds Incision: clean, dry and intact Uterine Fundus: firm, below umbilicus, nontender Lochia: minimal Ext: edema trace, no cords or calf tenderness      Assessment/Plan: 30 y.o.   POD# 3. [id number]                  Principal Problem:   -Postpartum care following cesarean delivery 3/5             - stable post-op   -Preterm labor in third trimester with preterm delivery             -Infants in NICU  doing well   -Preeclampsia, severe, third trimester             - s/p Mag 24 hrs w/ good diuresis             - labs stable, no neural sx             - BP remains elevated with Procardia 30XL in mild range, will increase Procardia to 60XL daily             - continue to monitor closely inpatient             - encourage rest when not at NICU bedside   Twin pregnancy, twins dichorionic and diamniotic   Status post primary low transverse cesarean section 3/5   -Postpartum hemorrhage - 1874cc    Anemia associated with acute blood loss             -- s/p 2 units PRBC transfused, hgb improved to 9.0 from nadir of 6.2             -  started oral Fe and Mag ox to continue 4-6 wks PP   -Anxiety disorder             - hx of anxiety on Zoloft, now increased to 50 mg, buspirone 7.5 mg added for episodic anxiety, mostly in eves and reports good effect                    Continue inpatient for BP management POC in consult w/ Desiree Miller, CNM, MSN 10/09/2020, 1110 AM

## 2020-10-10 NOTE — Discharge Summary (Addendum)
Postpartum Discharge Summary  Date of Service updated 10/10/2020     Patient Name: Desiree Miller DOB: September 16, 1990 MRN: [id number]  Date of admission: 10/05/2020 Delivery date:   [place], Alfredo [[id number]]  10/06/2020    Vonya, Ohalloran [[id number]]  10/06/2020   Delivering provider:    Etheleen, Valtierra [[id number]]  MODY, Elham, Fini [[id number]]  MODY, VAISHALI   Date of discharge: 10/10/2020  Admitting diagnosis: Hypertension in pregnancy, preeclampsia, severe, delivered [O14.14] Intrauterine pregnancy: Unknown     Secondary diagnosis:  Principal Problem:   Postpartum care following cesarean delivery 3/5 Active Problems:   Preterm labor in third trimester with preterm delivery   Preeclampsia, severe, third trimester   Twin pregnancy, twins dichorionic and diamniotic   Status post primary low transverse cesarean section 3/5   Postpartum hemorrhage - 1874cc   Anemia associated with acute blood loss   Anxiety disorder  Additional problems: ABL Anemia compounding chronic IDA    Discharge diagnosis: Preterm Pregnancy Delivered, Preeclampsia (severe), Anemia and PPH, anxiety                                               Post partum procedures:blood transfusion and IV Venofer x 1 dose, 24 hrs PP Magnesium sulfate  Augmentation: N/A Complications: [id number]  Hospital course: Onset of Labor With Unplanned C/S   30 y.o. yo G2P0112 at Unknown was admitted in [place] on 10/05/2020. Patient had a labor course significant for preterm labor at 28w4dwith cervical change in MAU. She was also diagnosed with severe pre-eclampsia. The patient went for cesarean section due to Malpresentation and Multifetal Gestation. Delivery details as follows: Membrane Rupture Time/Date:    ETaiana, Temkin[[[id number]] 12:02 AM    EMyrtie Cruise[[[id number]] 12:04 AM  ,   ECarolie, Mcilrath[[[id number]] 10/06/2020    EJaneka, Libman[[[id number]] 10/06/2020     Delivery Method:   ELarey Seat[[[id number]] C-Section, Low Transverse    EAlaylah, [place]ington[[[id number]] C-Section, Low Transverse   Details of operation can be found in separate operative note. She received 24 hours of Magnesium sulfate and was started on Procardia 366mXL daily. Pt. Had delayed PPH several hours after delivery/ Total EBL 1800cc. She received Hemabate x 1, Lysteda x 1, Cytotec 1000 mcg and current pitocin bag -300 cc bolus f/by regular rate and continue pitocin for 24hrs. IV Venofer was given x 1 dose, then 2 units PRBCs given on POD#1. On POD#3, her Procardia was increased to 6045mL daily and on POD#4, we added HCTZ 12.5mg8m5 days. Pt. Has significant anxiety had one panic attack during PP stay requiring PRN Xanax. Her Zoloft was increased to 50mg61m added Busiprone 7.5mg e52my 8 hrs PRN for anxiety for discharge. Per Dr. FoglemPamala Hurryr. Mody, Benjie Karvonens stable for discharge home with close follow up in the office on Monday. She purchased a BP cuff and will check her home BPs daily and notify for worsening BPs.  She is ambulating,tolerating a regular diet, passing flatus, and urinating well.  Patient is discharged home in stable condition 10/10/20.  Newborn Data: Birth date:   Frankson, Mistee, Soliman2[[id number]]2022    [place]ico, Eleana, Tocco2[[id number]]2022   Birth time:   Flow,Girard  LEILANIE RAUDA [[id number]]  12:02 AM    Myrtie Cruise [[id number]]  12:04 AM   Gender:   Ethelda, Deangelo [[id number]]  Female    Mikaylee, Arseneau [[id number]]  Female   Living status:   Roshelle, Traub [[id number]]  Living    Eufemia, Prindle [[id number]]  Living   Apgars:   Ande, Therrell [[id number]]  [address] [[id number]]  9  ,   QJJH, ERDE YCXKGY [[id number]]  8    Susannah, Carbin [[id number]]  9   Weight:   Cynia, Abruzzo [[id number]]  2020 g    Shakeyla, Giebler [[id number]]  1820 g    Magnesium Sulfate received: Yes: Seizure  prophylaxis BMZ received: Yes, several days prior to admission Rhophylac:N/A MMR:N/A T-DaP:Given prenatally Flu: Yes  Covid UTD Transfusion:Yes 2 units PRBCs  Physical exam  Vitals:   10/10/20 0017 10/10/20 0453 10/10/20 0815 10/10/20 1253  BP: (!) 140/97 140/80 (!) 142/97 (!) 141/88  Pulse: 80 74 70 77  Resp: 18 18 18 18   Temp: 98.1 F (36.7 C) 98 F (36.7 C) 98.1 F (36.7 C) 98.2 F (36.8 C)  TempSrc: Oral Oral Oral Oral  SpO2: 98% 98% 98% 98%  Weight:  75.1 kg     Alert and Oriented X3             Lungs: Clear and unlabored             Heart: regular rate and rhythm / no murmurs             Abdomen: soft, non-tender, non-distended, active bowel sounds             Fundus: firm, non-tender, below umbilicus             Dressing: honeycomb with steri-strips c/d/i              Incision:  approximated with sutures / no erythema / no ecchymosis / no drainage             Perineum: intact             Lochia: small rubra on pad              Extremities: trace LE edema, no calf pain or tenderness, +2 DTRs, no clonus bilaterally   Labs: Lab Results  Component Value Date   WBC 16.2 (H) 10/07/2020   HGB 9.0 (L) 10/07/2020   HCT 26.6 (L) 10/07/2020   MCV 97.8 10/07/2020   PLT 179 10/07/2020   CMP Latest Ref Rng & Units 10/09/2020  Glucose 70 - 99 mg/dL 86  BUN 6 - 20 mg/dL 12  Creatinine 0.44 - 1.00 mg/dL 0.72  Sodium 135 - 145 mmol/L 137  Potassium 3.5 - 5.1 mmol/L 5.0  Chloride 98 - 111 mmol/L 102  CO2 22 - 32 mmol/L 27  Calcium 8.9 - 10.3 mg/dL 8.9  Total Protein 6.5 - 8.1 g/dL 5.1(L)  Total Bilirubin 0.3 - 1.2 mg/dL 0.4  Alkaline Phos 38 - 126 U/L 122  AST 15 - 41 U/L 38  ALT 0 - 44 U/L 32   Edinburgh Score: Edinburgh Postnatal Depression Scale Screening Tool 10/08/2020  I have been able to laugh and see the funny side of things. 1  I have looked forward with enjoyment to things. 0  I have blamed myself unnecessarily when things went wrong. 1  I have been anxious or  worried for no good  reason. 3  I have felt scared or panicky for no good reason. 3  Things have been getting on top of me. 1  I have been so unhappy that I have had difficulty sleeping. 1  I have felt sad or miserable. 1  I have been so unhappy that I have been crying. 1  The thought of harming myself has occurred to me. 0  Edinburgh Postnatal Depression Scale Total 12      After visit meds:  Allergies as of 10/10/2020      Reactions   Penicillins Rash      Medication List    STOP taking these medications   calcium carbonate 500 MG chewable tablet Commonly known as: TUMS - dosed in mg elemental calcium   labetalol 100 MG tablet Commonly known as: NORMODYNE     TAKE these medications   acetaminophen 500 MG tablet Commonly known as: TYLENOL Take 2 tablets (1,000 mg total) by mouth every 6 (six) hours as needed.   busPIRone 7.5 MG tablet Commonly known as: BUSPAR Take 1 tablet (7.5 mg total) by mouth every 8 (eight) hours as needed (increased anxiety).   coconut oil Oil Apply 1 application topically as needed.   famotidine 20 MG tablet Commonly known as: PEPCID Take 20 mg by mouth 2 (two) times daily.   ferrous sulfate 325 (65 FE) MG tablet Take 1 tablet (325 mg total) by mouth daily with breakfast. What changed: when to take this   hydrochlorothiazide 12.5 MG capsule Commonly known as: MICROZIDE Take 1 capsule (12.5 mg total) by mouth daily. Start taking on: October 11, 2020   ibuprofen 600 MG tablet Commonly known as: ADVIL Take 1 tablet (600 mg total) by mouth every 6 (six) hours.   NIFEdipine 60 MG 24 hr tablet Commonly known as: ADALAT CC Take 1 tablet (60 mg total) by mouth daily. Start taking on: October 11, 2020   Oxycodone HCl 10 MG Tabs Take 1 tablet (10 mg total) by mouth every 4 (four) hours as needed for moderate pain.   prenatal multivitamin Tabs tablet Take 1 tablet by mouth daily at 12 noon.   senna-docusate 8.6-50 MG tablet Commonly known  as: Senokot-S Take 2 tablets by mouth daily. Start taking on: October 11, 2020   sertraline 50 MG tablet Commonly known as: ZOLOFT Take 1 tablet (50 mg total) by mouth at bedtime. What changed:   medication strength  how much to take        Discharge home in stable condition Infant Feeding: Bottle, Breast  Infant Disposition:NICU Discharge instruction: per After Visit Summary and Postpartum booklet. Activity: Advance as tolerated. Pelvic rest for 6 weeks.  Diet: low salt diet Anticipated Birth Control: not discussed Postpartum Appointment:6 weeks Additional Postpartum F/U: Postpartum Depression checkup and BP check 4 days Future Appointments:No future appointments. Follow up Visit:  Follow-up Information    Aloha Gell, MD Follow up on 10/14/2020.   Specialty: Obstetrics and Gynecology Contact information: [place] [address] [phone]               POC in consult with Dr. Tiburcio Bash and Dr. Benjie Karvonen     10/10/2020 Darliss Cheney, CNM

## 2020-10-14 DIAGNOSIS — R309 Painful micturition, unspecified: Secondary | ICD-10-CM | POA: Diagnosis not present

## 2020-10-14 DIAGNOSIS — O1415 Severe pre-eclampsia, complicating the puerperium: Secondary | ICD-10-CM | POA: Diagnosis not present

## 2020-10-15 ENCOUNTER — Ambulatory Visit: Payer: Self-pay

## 2020-10-15 NOTE — Lactation Note (Signed)
This note was copied from a baby's chart. Lactation Consultation Note  Patient Name: Desiree Miller [id number] Date: 10/15/2020 Reason for consult: Follow-up assessment;Primapara;1st time breastfeeding;NICU baby;Multiple gestation;Late-preterm 34-36.6wks;Infant < 6lbs Age:31 days  LC in to visit with Mom and FOB as they were both doing STS with their babies.   Mom reports pumping going well.  She is pumping every 2-3 hrs, both breasts during the day, and one 4 hr stretch at night.  Volume is 120-150 ml per pumping.   Mom understands that babies are maturing and getting close to oral feeding.  Encouraged STS with babies as much as possible.  Encouraged Mom to pump in NICU after doing STS.  No further questions currently.  Mom to contact [facility] via NICU RN.     Lactation Tools Discussed/Used Tools: Pump;Flanges Breast pump type: Double-Electric Breast Pump Pumping frequency: Q3 Pumped volume: 150 mL  Interventions Interventions: Breast feeding basics reviewed;Education;Skin to skin;Breast massage;Hand express;DEBP  Consult Status Consult Status: Follow-up Date: 10/22/20 Follow-up type: In-patient    Desiree Miller 10/15/2020, 3:34 PM

## 2020-10-22 DIAGNOSIS — N390 Urinary tract infection, site not specified: Secondary | ICD-10-CM | POA: Diagnosis not present

## 2020-10-22 DIAGNOSIS — O1415 Severe pre-eclampsia, complicating the puerperium: Secondary | ICD-10-CM | POA: Diagnosis not present

## 2020-10-23 ENCOUNTER — Ambulatory Visit: Payer: Self-pay

## 2020-10-23 NOTE — Lactation Note (Signed)
This note was copied from a baby's chart. Lactation Consultation Note  Patient Name: Desiree Miller [id number] Date: 10/23/2020 Reason for consult: Follow-up assessment;Primapara;1st time breastfeeding;Infant < 6lbs;Multiple gestation;Late-preterm 34-36.6wks;NICU baby Age:31 wk.o.   LC in to assist/assess with twin B [name]) first attempt at breastfeeding.  Baby positioned in football hold on left breast with nipple shield.  Baby initially latched and did non-nutritive sucking.  After 5 minutes, [name] became nutritive and deep jaw extensions noted with regular pauses.  After 8 minutes, baby had a coughing spell and hiccups and placed hands up to face.  Educated Mom on signs of stress and how to take baby off and regroup.   Mom is a natural with the babies and very excited that boys did so well.  Encouraged post breastfeeding pumping to support milk supply    LATCH Score Latch: Grasps breast easily, tongue down, lips flanged, rhythmical sucking. (initially sleepy for 5 mins)  Audible Swallowing: Spontaneous and intermittent  Type of Nipple: Everted at rest and after stimulation  Comfort (Breast/Nipple): Soft / non-tender  Hold (Positioning): Assistance needed to correctly position infant at breast and maintain latch.  LATCH Score: 9   Lactation Tools Discussed/Used Tools: Pump;Nipple Shields Nipple shield size: 20 Breast pump type: Double-Electric Breast Pump Pumping frequency: Q3 Pumped volume: 180 mL (150-180 ml per pumping)  Interventions Interventions: Breast feeding basics reviewed;Assisted with latch;Skin to skin;Breast massage;Hand express;Breast compression;Adjust position;Support pillows;Position options;DEBP;Education   Consult Status Consult Status: Follow-up Date: 10/25/20 Follow-up type: In-patient  LC appt at 2 pm on 3/25  [name] 10/23/2020, 2:54 PM

## 2020-10-23 NOTE — Lactation Note (Addendum)
This note was copied from a baby's chart. Lactation Consultation Note  Patient Name: Desiree Miller [id number] Date: 10/23/2020 Reason for consult: Follow-up assessment;Difficult latch;Primapara;1st time breastfeeding;Infant < 6lbs;NICU baby;Late-preterm 34-36.6wks;Multiple gestation Age:31 wk.o.   LC in to assist/assess with twin A Reyne Dumas) first attempt at the breast.  Baby has been showing consistent feeding readiness cues.  Baby tried to latch onto breast, but Mom's nipples are short shafted.  Initiated a 20 mm nipple shield, showing Mom how to apply this properly.  Nipple pulled well into shield.  Baby latched and after a couple minutes, became nutritive with deep jaw extensions and audible swallows.  Mom assisted to support her breast firmly.  Baby fed for 13 mins without any signs of stress.  RN gavaged 1/2 of feeding as baby was very contented after breastfeeding.   Encouraged continued double pumping after breastfeeding to support milk supply.     LATCH Score Latch: Grasps breast easily, tongue down, lips flanged, rhythmical sucking.  Audible Swallowing: Spontaneous and intermittent  Type of Nipple: Everted at rest and after stimulation  Comfort (Breast/Nipple): Soft / non-tender  Hold (Positioning): Assistance needed to correctly position infant at breast and maintain latch.  LATCH Score: 9   Lactation Tools Discussed/Used Tools: Pump;Nipple Shields Nipple shield size: 20 Breast pump type: Double-Electric Breast Pump Pumping frequency: Q3 Pumped volume: 180 mL (150-180 ml per pumping)  Interventions Interventions: Breast feeding basics reviewed;Assisted with latch;Skin to skin;Breast massage;Hand express;Breast compression;Adjust position;Support pillows;Position options;Expressed milk;DEBP;Education  Consult Status Consult Status: Follow-up Date: 10/25/20 Follow-up type: In-patient  Next LC appt 10/25/20 at 2 pm  [name] 10/23/2020, 2:49 PM

## 2020-10-25 ENCOUNTER — Ambulatory Visit: Payer: Self-pay

## 2020-10-25 NOTE — Lactation Note (Addendum)
This note was copied from a baby's chart. Lactation Consultation Note  Patient Name: Desiree Miller [id number] Date: 10/25/2020 Reason for consult: Follow-up assessment;Primapara;1st time breastfeeding;Multiple gestation;Infant < 6lbs;Late-preterm 34-36.6wks;NICU baby Age:31 wk.o.  Twin A LC in to assist/assess with breastfeeding twin A "[name]" Baby awakes for feedings and cues.  Mom's breasts full and nipples flattened.  LC helped with pre-pumping to pull nipple out and soften breast around areola.  Nipple shield placed onto more erect nipple after pumping.  Baby stayed latched onto right breast for 25 mins.  [name] was sleepy at times, but then he would get some rhythm and suck/swallow more vigorously. He stayed on a total of 25 mins, becoming sleepy at the end, milk noted in shield.    Twin B Twin B "[name]" was cueing and not wanting to wait his turn, so LC assisted with tandem nursing with both babies in football hold.  [name] latched onto left breast taking a few minutes to start sucking more consistently.  After [name] came off breast, [name] continued suck/swallowing more on breast for an additional 10 mins.  LC had to take baby off breast after 30 mins so not to overtire baby.  Nipple shield half full of milk.    AC/PC weights  Twin A "[name]" 10 gms/10 ml  Twin B "[name]"  6 gms/6 ml.  Both babies appeared contented and relaxed post breastfeeding.  [name] SLP suggested that the gavage feeding be minus amount transferred at breast.  Babies are on the algorithm for supplement, but with the information obtained from AC/PC weight, it was decided to subtract amount taken at breast.  Assisted Mom to post breastfeed pump a full pumping.    Lactation Tools Discussed/Used Tools: Pump;Nipple Shields Nipple shield size: 20 Breast pump type: Double-Electric Breast Pump  Interventions Interventions: Assisted with latch;Skin to skin;Breast massage;Hand express;Pre-pump if  needed;Breast compression;Adjust position;Support pillows;Position options;DEBP   Consult Status Consult Status: Follow-up Date: 10/27/20 Follow-up type: In-patient 2pm    [name] 10/25/2020, 3:36 PM

## 2020-11-01 ENCOUNTER — Ambulatory Visit: Payer: Self-pay

## 2020-11-01 NOTE — Lactation Note (Signed)
This note was copied from a baby's chart. Lactation Consultation Note  Patient Name: Desiree Miller RSWNI'O Date: 11/01/2020 Reason for consult: NICU baby;Multiple gestation Age:31 wk.o.   LC counseled on nutritive vs. non-nutritive suckling of preterm infants. Answered mom's questions about nutritive value of human milk. Assisted with laid back biological positioning with baby b using 30mm shield.  Observed bf Baby B: mostly non-nutritive with intermittent rhythmic suck/swallows. Mom able to identify difference. Baby with 3-4 bradys that coincided with swallows. SLP/RN present. Feeding was discontinued and baby placed sts. Mom instructed to pre-pump for feedings. Will re-visit next Tuesday with SLP and mother to re-evaluate.   Feeding Mother's Current Feeding Choice: Breast Milk  LATCH Score: 7  Consult Status Consult Status: Follow-up (2pm) Date: 11/05/20 Follow-up type: In-patient   Elder Negus, MA IBCLC 11/01/2020, 3:28 PM

## 2020-11-05 ENCOUNTER — Ambulatory Visit: Payer: Self-pay

## 2020-11-05 DIAGNOSIS — O1415 Severe pre-eclampsia, complicating the puerperium: Secondary | ICD-10-CM | POA: Diagnosis not present

## 2020-11-05 NOTE — Lactation Note (Signed)
This note was copied from a baby's chart. Lactation Consultation Note  Patient Name: Desiree Miller [id number] Date: 11/05/2020 Reason for consult: NICU baby;Follow-up assessment;Mother's request;Nipple pain/trauma Age:31 wk.o.  Maternal Data  Mom with recent onset of bilateral nipple pain with redness / inflammation. Mom concerned that it could be yeast because of recent antibiotic use. Cannot rule that out, but from her description of the pain and the appearance of her nipples, the pain is more likely from pumping with high vacuum.  Reviewed common causes of nipple pain: mastitis, yeast, and vasospasm. Reviewed APNO cream and suggested she consult with OB.  Mom plans to discontinue latching until nipples heal. Will plan f/u.  Feeding Mother's Current Feeding Choice: Breast Milk Nipple Type: Dr. Levert Feinstein Preemie  Consult Status Consult Status: Follow-up Follow-up type: In-patient   Elder Negus, MA IBCLC 11/05/2020, 5:12 PM

## 2020-11-08 ENCOUNTER — Inpatient Hospital Stay (HOSPITAL_COMMUNITY): Admit: 2020-11-08 | Payer: BC Managed Care – PPO | Admitting: Obstetrics

## 2020-11-10 ENCOUNTER — Ambulatory Visit: Payer: Self-pay

## 2020-11-10 NOTE — Lactation Note (Signed)
This note was copied from a baby's chart. Lactation Consultation Note  Patient Name: Desiree Miller [id number] Date: 11/10/2020 Reason for consult: Follow-up assessment;Nipple pain/trauma;Multiple gestation;Early term 37-38.6wks;NICU baby;1st time breastfeeding;[place] Age:31 wk.o.  LC in to visit with P2 Mom of 38w5 AGA twin boys.  Mom has a great milk supply and babies are taking fortified to 24 calorie EBM by bottle and today they were made ad lib.  Both babies are gaining 1-2 oz per day.    Mom had complained of nipple soreness and wanted to take a break from latching and focus on bottle feeding her EBM.    Mom pumping both breasts every 3 hrs during the day and every 4 hrs at night.    Talked to Mom about OP lactation f/u.  [facility] has [place] [facility] [employer] and suggested she inquire seeing LC when taking babies for Ped appt.    Mom given the lactation brochure and phone numbers identified for OP appt and for any questions or concerns.     Lactation Tools Discussed/Used Tools: Pump Breast pump type: Double-Electric Breast Pump Pumping frequency: Q 3-4 hrs Pumped volume: 150 mL  Interventions Interventions: DEBP   Consult Status Consult Status: Follow-up Date: 11/13/20 Follow-up type: In-patient    [name] 11/10/2020, 2:46 PM

## 2020-11-14 ENCOUNTER — Ambulatory Visit: Payer: Self-pay

## 2020-11-14 NOTE — Lactation Note (Signed)
This note was copied from a baby's chart. Lactation Consultation Note  Patient Name: Desiree Miller VANVB'T Date: 11/14/2020 Age:31 wk.o.  LC into for visit prior to discharge. Parents are not here yet. LC will come back later.   Desiree Miller 11/14/2020, 2:31 PM

## 2020-12-20 ENCOUNTER — Encounter (HOSPITAL_COMMUNITY): Payer: Self-pay | Admitting: Obstetrics & Gynecology

## 2021-02-28 ENCOUNTER — Other Ambulatory Visit: Payer: Self-pay | Admitting: Internal Medicine

## 2021-03-01 ENCOUNTER — Other Ambulatory Visit: Payer: Self-pay

## 2021-03-01 ENCOUNTER — Emergency Department (HOSPITAL_COMMUNITY)
Admission: EM | Admit: 2021-03-01 | Discharge: 2021-03-01 | Discharge status: Against Medical Advice | Payer: Self-pay | Attending: Emergency Medicine | Admitting: Emergency Medicine

## 2021-03-01 DIAGNOSIS — R110 Nausea: Secondary | ICD-10-CM | POA: Insufficient documentation

## 2021-03-01 DIAGNOSIS — Z5321 Procedure and treatment not carried out due to patient leaving prior to being seen by health care provider: Secondary | ICD-10-CM | POA: Insufficient documentation

## 2021-03-01 DIAGNOSIS — R1030 Lower abdominal pain, unspecified: Secondary | ICD-10-CM | POA: Insufficient documentation

## 2021-03-01 DIAGNOSIS — R42 Dizziness and giddiness: Secondary | ICD-10-CM | POA: Insufficient documentation

## 2021-03-01 LAB — CBC
HCT: 37.5 % (ref 36.0–46.0)
Hemoglobin: 13.2 g/dL (ref 12.0–15.0)
MCH: 32.4 pg (ref 26.0–34.0)
MCHC: 35.2 g/dL (ref 30.0–36.0)
MCV: 91.9 fL (ref 80.0–100.0)
Platelets: 271 10*3/uL (ref 150–400)
RBC: 4.08 MIL/uL (ref 3.87–5.11)
RDW: 13.1 % (ref 11.5–15.5)
WBC: 7.5 10*3/uL (ref 4.0–10.5)
nRBC: 0 % (ref 0.0–0.2)

## 2021-03-01 LAB — COMPREHENSIVE METABOLIC PANEL
ALT: 12 U/L (ref 0–44)
AST: 14 U/L — ABNORMAL LOW (ref 15–41)
Albumin: 4 g/dL (ref 3.5–5.0)
Alkaline Phosphatase: 82 U/L (ref 38–126)
Anion gap: 9 (ref 5–15)
BUN: 18 mg/dL (ref 6–20)
CO2: 24 mmol/L (ref 22–32)
Calcium: 9.4 mg/dL (ref 8.9–10.3)
Chloride: 103 mmol/L (ref 98–111)
Creatinine, Ser: 0.8 mg/dL (ref 0.44–1.00)
GFR, Estimated: >60 mL/min (ref >60)
Glucose, Bld: 101 mg/dL — ABNORMAL HIGH (ref 70–99)
Potassium: 3.8 mmol/L (ref 3.5–5.1)
Sodium: 136 mmol/L (ref 135–145)
Total Bilirubin: 0.4 mg/dL (ref 0.3–1.2)
Total Protein: 7.1 g/dL (ref 6.5–8.1)

## 2021-03-01 LAB — URINALYSIS, ROUTINE W REFLEX MICROSCOPIC
Bilirubin Urine: NEGATIVE
Glucose, UA: NEGATIVE mg/dL
Hgb urine dipstick: NEGATIVE
Ketones, ur: NEGATIVE mg/dL
Nitrite: NEGATIVE
Protein, ur: NEGATIVE mg/dL
Specific Gravity, Urine: 1.024 (ref 1.005–1.030)
pH: 5 (ref 5.0–8.0)

## 2021-03-01 LAB — I-STAT BETA HCG BLOOD, ED (MC, WL, AP ONLY): I-stat hCG, quantitative: <5 m[IU]/mL (ref <5)

## 2021-03-01 LAB — LIPASE, BLOOD: Lipase: 36 U/L (ref 11–51)

## 2021-03-01 MED ORDER — ONDANSETRON 4 MG PO TBDP: 4.0000 mg | ORAL_TABLET | Freq: Once | ORAL | Status: DC | PRN

## 2021-03-01 NOTE — ED Notes (Signed)
Pt did not want to wait had to leave.

## 2021-03-01 NOTE — ED Triage Notes (Signed)
Pt c/o sudden onset of sever sharp lower abdominal pain this am along with lightheadedness, flushed, clammy, and persistent nausea. C-section March 6.

## 2021-03-03 DIAGNOSIS — R5383 Other fatigue: Secondary | ICD-10-CM | POA: Diagnosis not present

## 2021-03-12 DIAGNOSIS — R109 Unspecified abdominal pain: Secondary | ICD-10-CM | POA: Diagnosis not present

## 2021-04-10 ENCOUNTER — Other Ambulatory Visit: Payer: Self-pay | Admitting: Internal Medicine

## 2021-04-10 DIAGNOSIS — R2241 Localized swelling, mass and lump, right lower limb: Secondary | ICD-10-CM

## 2021-04-10 DIAGNOSIS — M7989 Other specified soft tissue disorders: Secondary | ICD-10-CM

## 2021-04-10 DIAGNOSIS — R058 Other specified cough: Secondary | ICD-10-CM | POA: Diagnosis not present

## 2021-06-06 DIAGNOSIS — F419 Anxiety disorder, unspecified: Secondary | ICD-10-CM | POA: Diagnosis not present

## 2021-06-06 DIAGNOSIS — Z30011 Encounter for initial prescription of contraceptive pills: Secondary | ICD-10-CM | POA: Diagnosis not present

## 2021-08-22 DIAGNOSIS — F53 Postpartum depression: Secondary | ICD-10-CM | POA: Diagnosis not present

## 2021-08-22 DIAGNOSIS — F419 Anxiety disorder, unspecified: Secondary | ICD-10-CM | POA: Diagnosis not present

## 2022-02-11 DIAGNOSIS — F419 Anxiety disorder, unspecified: Secondary | ICD-10-CM | POA: Diagnosis not present

## 2022-03-26 ENCOUNTER — Telehealth: Payer: Self-pay | Admitting: *Deleted

## 2022-03-26 NOTE — Chronic Care Management (AMB) (Signed)
  Care Coordination  Outreach Note  03/26/2022 Name: Desiree Miller MRN: [id number] DOB: 07/15/1991   Care Coordination Outreach Attempts  An unsuccessful telephone outreach was attempted today to offer the patient information about available care coordination services as a benefit of their health plan.   Follow Up Plan:  Additional outreach attempts will be made to offer the patient care coordination information and services.   Encounter Outcome:  No Answer  Gwenevere Ghazi  Care Coordination Care Guide  Direct Dial: [phone]

## 2022-03-27 DIAGNOSIS — R5383 Other fatigue: Secondary | ICD-10-CM | POA: Diagnosis not present

## 2022-03-27 DIAGNOSIS — F419 Anxiety disorder, unspecified: Secondary | ICD-10-CM | POA: Diagnosis not present

## 2022-03-30 NOTE — Chronic Care Management (AMB) (Signed)
  Care Coordination  Outreach Note  03/30/2022 Name: Desiree Miller MRN: [id number] DOB: 1991/02/09   Care Coordination Outreach Attempts  A second unsuccessful outreach was attempted today to offer the patient with information about available care coordination services as a benefit of their health plan.     Follow Up Plan:  Additional outreach attempts will be made to offer the patient care coordination information and services.   Encounter Outcome:  No Answer   Gwenevere Ghazi  Care Coordination Care Guide  Direct Dial: [phone]

## 2022-04-03 DIAGNOSIS — Z0000 Encounter for general adult medical examination without abnormal findings: Secondary | ICD-10-CM | POA: Diagnosis not present

## 2022-04-03 DIAGNOSIS — F419 Anxiety disorder, unspecified: Secondary | ICD-10-CM | POA: Diagnosis not present

## 2022-04-03 DIAGNOSIS — Z1331 Encounter for screening for depression: Secondary | ICD-10-CM | POA: Diagnosis not present

## 2022-04-03 DIAGNOSIS — Z1339 Encounter for screening examination for other mental health and behavioral disorders: Secondary | ICD-10-CM | POA: Diagnosis not present

## 2022-04-03 DIAGNOSIS — R82998 Other abnormal findings in urine: Secondary | ICD-10-CM | POA: Diagnosis not present

## 2022-04-07 NOTE — Chronic Care Management (AMB) (Signed)
  Care Coordination  Outreach Note  04/07/2022 Name: Dezra Mandella MRN: [id number] DOB: 27-Apr-1991   Care Coordination Outreach Attempts  A third unsuccessful outreach was attempted today to offer the patient with information about available care coordination services as a benefit of their health plan.   Follow Up Plan:  No further outreach attempts will be made at this time. We have been unable to contact the patient to offer or enroll patient in care coordination services  Encounter Outcome:  No Answer   Gwenevere Ghazi  Care Coordination Care Guide  Direct Dial: [phone]

## 2022-06-11 DIAGNOSIS — D1801 Hemangioma of skin and subcutaneous tissue: Secondary | ICD-10-CM | POA: Diagnosis not present

## 2022-06-11 DIAGNOSIS — D225 Melanocytic nevi of trunk: Secondary | ICD-10-CM | POA: Diagnosis not present

## 2022-06-11 DIAGNOSIS — L858 Other specified epidermal thickening: Secondary | ICD-10-CM | POA: Diagnosis not present

## 2022-06-11 DIAGNOSIS — D224 Melanocytic nevi of scalp and neck: Secondary | ICD-10-CM | POA: Diagnosis not present

## 2022-06-11 DIAGNOSIS — D2271 Melanocytic nevi of right lower limb, including hip: Secondary | ICD-10-CM | POA: Diagnosis not present

## 2022-06-11 DIAGNOSIS — D485 Neoplasm of uncertain behavior of skin: Secondary | ICD-10-CM | POA: Diagnosis not present

## 2022-12-08 DIAGNOSIS — J0100 Acute maxillary sinusitis, unspecified: Secondary | ICD-10-CM | POA: Diagnosis not present

## 2022-12-08 DIAGNOSIS — R0981 Nasal congestion: Secondary | ICD-10-CM | POA: Diagnosis not present

## 2022-12-23 DIAGNOSIS — Z01419 Encounter for gynecological examination (general) (routine) without abnormal findings: Secondary | ICD-10-CM | POA: Diagnosis not present

## 2022-12-23 DIAGNOSIS — R87610 Atypical squamous cells of undetermined significance on cytologic smear of cervix (ASC-US): Secondary | ICD-10-CM | POA: Diagnosis not present

## 2022-12-23 DIAGNOSIS — R87810 Cervical high risk human papillomavirus (HPV) DNA test positive: Secondary | ICD-10-CM | POA: Diagnosis not present

## 2022-12-23 DIAGNOSIS — Z1231 Encounter for screening mammogram for malignant neoplasm of breast: Secondary | ICD-10-CM | POA: Diagnosis not present

## 2023-02-01 DIAGNOSIS — D069 Carcinoma in situ of cervix, unspecified: Secondary | ICD-10-CM

## 2023-02-01 HISTORY — DX: Carcinoma in situ of cervix, unspecified: D06.9

## 2023-02-09 DIAGNOSIS — R87613 High grade squamous intraepithelial lesion on cytologic smear of cervix (HGSIL): Secondary | ICD-10-CM | POA: Diagnosis not present

## 2023-02-09 DIAGNOSIS — Z3202 Encounter for pregnancy test, result negative: Secondary | ICD-10-CM | POA: Diagnosis not present

## 2023-02-10 DIAGNOSIS — R87810 Cervical high risk human papillomavirus (HPV) DNA test positive: Secondary | ICD-10-CM | POA: Diagnosis not present

## 2023-02-15 DIAGNOSIS — D069 Carcinoma in situ of cervix, unspecified: Secondary | ICD-10-CM | POA: Diagnosis not present

## 2023-02-15 DIAGNOSIS — R87810 Cervical high risk human papillomavirus (HPV) DNA test positive: Secondary | ICD-10-CM | POA: Diagnosis not present

## 2023-02-22 ENCOUNTER — Encounter (HOSPITAL_BASED_OUTPATIENT_CLINIC_OR_DEPARTMENT_OTHER): Payer: Self-pay | Admitting: Obstetrics

## 2023-02-22 ENCOUNTER — Other Ambulatory Visit: Payer: Self-pay

## 2023-02-22 NOTE — Progress Notes (Addendum)
Spoke w/ via phone for pre-op interview---pt Lab needs dos----  UPT             Lab results------none COVID test -----patient states asymptomatic no test needed Arrive at -------0945 NPO after MN NO Solid Food.  Clear liquids from MN until---0845 Med rec completed Medications to take morning of surgery -----Zoloft Diabetic medication -----n/a Patient instructed no nail polish to be worn day of surgery Patient instructed to bring photo id and insurance card day of surgery Patient aware to have Driver (ride ) / caregiver Husband Desiree Miller  for 24 hours after surgery  Patient Special Instructions ----- Pre-Op special Instructions ----- Patient verbalized understanding of instructions that were given at this phone interview. Patient denies shortness of breath, chest pain, fever, cough at this phone interview.

## 2023-02-26 ENCOUNTER — Other Ambulatory Visit: Payer: Self-pay | Admitting: Obstetrics

## 2023-03-02 ENCOUNTER — Other Ambulatory Visit: Payer: Self-pay | Admitting: Obstetrics

## 2023-03-04 ENCOUNTER — Ambulatory Visit (HOSPITAL_BASED_OUTPATIENT_CLINIC_OR_DEPARTMENT_OTHER): Admission: RE | Admit: 2023-03-04 | Payer: BC Managed Care – PPO | Source: Home / Self Care | Admitting: Obstetrics

## 2023-03-04 DIAGNOSIS — Z01818 Encounter for other preprocedural examination: Secondary | ICD-10-CM

## 2023-03-04 HISTORY — DX: Cardiac murmur, unspecified: R01.1

## 2023-03-04 HISTORY — DX: Gastro-esophageal reflux disease without esophagitis: K21.9

## 2023-03-04 SURGERY — COLPOSCOPY
Anesthesia: Choice

## 2023-03-09 ENCOUNTER — Encounter (HOSPITAL_COMMUNITY): Payer: Self-pay

## 2023-03-11 NOTE — Progress Notes (Signed)
Spoke with Hughes Better at dr Ernestina Penna office and made aware pt has not returned pre op phone messages left for 03-15-2023 surgery

## 2023-03-15 ENCOUNTER — Ambulatory Visit (HOSPITAL_BASED_OUTPATIENT_CLINIC_OR_DEPARTMENT_OTHER): Payer: Self-pay | Admitting: Anesthesiology

## 2023-03-15 ENCOUNTER — Ambulatory Visit (HOSPITAL_BASED_OUTPATIENT_CLINIC_OR_DEPARTMENT_OTHER): Payer: BC Managed Care – PPO | Admitting: Anesthesiology

## 2023-03-15 ENCOUNTER — Other Ambulatory Visit: Payer: Self-pay

## 2023-03-15 ENCOUNTER — Encounter (HOSPITAL_BASED_OUTPATIENT_CLINIC_OR_DEPARTMENT_OTHER): Payer: Self-pay | Admitting: Obstetrics

## 2023-03-15 ENCOUNTER — Encounter (HOSPITAL_BASED_OUTPATIENT_CLINIC_OR_DEPARTMENT_OTHER)
Admission: RE | Disposition: A | Discharge status: Home / Self Care | Payer: Self-pay | Source: Home / Self Care | Attending: Obstetrics

## 2023-03-15 ENCOUNTER — Ambulatory Visit (HOSPITAL_BASED_OUTPATIENT_CLINIC_OR_DEPARTMENT_OTHER)
Admission: RE | Admit: 2023-03-15 | Discharge: 2023-03-15 | Disposition: A | Discharge status: Home / Self Care | Payer: BC Managed Care – PPO | Source: Home / Self Care | Attending: Obstetrics | Admitting: Obstetrics

## 2023-03-15 DIAGNOSIS — Z793 Long term (current) use of hormonal contraceptives: Secondary | ICD-10-CM | POA: Diagnosis not present

## 2023-03-15 DIAGNOSIS — D067 Carcinoma in situ of other parts of cervix: Secondary | ICD-10-CM | POA: Insufficient documentation

## 2023-03-15 DIAGNOSIS — D069 Carcinoma in situ of cervix, unspecified: Secondary | ICD-10-CM

## 2023-03-15 DIAGNOSIS — Z01818 Encounter for other preprocedural examination: Secondary | ICD-10-CM

## 2023-03-15 DIAGNOSIS — K219 Gastro-esophageal reflux disease without esophagitis: Secondary | ICD-10-CM | POA: Insufficient documentation

## 2023-03-15 HISTORY — DX: Anemia, unspecified: D64.9

## 2023-03-15 HISTORY — DX: Generalized anxiety disorder: F41.1

## 2023-03-15 HISTORY — PX: LEEP: SHX91

## 2023-03-15 HISTORY — PX: COLPOSCOPY: SHX161

## 2023-03-15 LAB — POCT PREGNANCY, URINE: Preg Test, Ur: NEGATIVE

## 2023-03-15 SURGERY — COLPOSCOPY
Anesthesia: General | Site: Vagina

## 2023-03-15 MED ORDER — DEXAMETHASONE SODIUM PHOSPHATE 10 MG/ML IJ SOLN
INTRAMUSCULAR | Status: DC | PRN
  Administered 2023-03-15: 10 mg via INTRAVENOUS

## 2023-03-15 MED ORDER — MIDAZOLAM HCL 2 MG/2ML IJ SOLN
INTRAMUSCULAR | Status: AC
  Filled 2023-03-15: qty 2

## 2023-03-15 MED ORDER — DEXAMETHASONE SODIUM PHOSPHATE 10 MG/ML IJ SOLN
INTRAMUSCULAR | Status: AC
  Filled 2023-03-15: qty 1

## 2023-03-15 MED ORDER — LIDOCAINE 2% (20 MG/ML) 5 ML SYRINGE
INTRAMUSCULAR | Status: DC | PRN
  Administered 2023-03-15: 60 mg via INTRAVENOUS

## 2023-03-15 MED ORDER — ACETIC ACID 4% SOLUTION
Status: DC | PRN
  Administered 2023-03-15: 1 via TOPICAL

## 2023-03-15 MED ORDER — LACTATED RINGERS IV SOLN: INTRAVENOUS | Status: DC

## 2023-03-15 MED ORDER — FENTANYL CITRATE (PF) 100 MCG/2ML IJ SOLN
INTRAMUSCULAR | Status: AC
  Filled 2023-03-15: qty 2

## 2023-03-15 MED ORDER — ONDANSETRON HCL 4 MG/2ML IJ SOLN
INTRAMUSCULAR | Status: AC
  Filled 2023-03-15: qty 2

## 2023-03-15 MED ORDER — MIDAZOLAM HCL 5 MG/5ML IJ SOLN
INTRAMUSCULAR | Status: DC | PRN
  Administered 2023-03-15: 2 mg via INTRAVENOUS

## 2023-03-15 MED ORDER — LIDOCAINE HCL (PF) 2 % IJ SOLN
INTRAMUSCULAR | Status: AC
  Filled 2023-03-15: qty 5

## 2023-03-15 MED ORDER — PROPOFOL 10 MG/ML IV BOLUS
INTRAVENOUS | Status: DC | PRN
  Administered 2023-03-15: 50 mg via INTRAVENOUS
  Administered 2023-03-15: 150 mg via INTRAVENOUS
  Administered 2023-03-15 (×3): 50 mg via INTRAVENOUS

## 2023-03-15 MED ORDER — ACETAMINOPHEN 325 MG PO TABS: 650.0000 mg | ORAL_TABLET | Freq: Four times a day (QID) | ORAL | 1 refills | Status: AC | PRN

## 2023-03-15 MED ORDER — MEPERIDINE HCL 25 MG/ML IJ SOLN: 6.2500 mg | INTRAMUSCULAR | Status: DC | PRN

## 2023-03-15 MED ORDER — LIDOCAINE-EPINEPHRINE 1 %-1:100000 IJ SOLN
INTRAMUSCULAR | Status: DC | PRN
  Administered 2023-03-15: 14 mL

## 2023-03-15 MED ORDER — FENTANYL CITRATE (PF) 100 MCG/2ML IJ SOLN
25.0000 ug | INTRAMUSCULAR | Status: DC | PRN
  Administered 2023-03-15 (×3): 50 ug via INTRAVENOUS

## 2023-03-15 MED ORDER — OXYCODONE HCL 5 MG PO TABS: 5.0000 mg | ORAL_TABLET | Freq: Once | ORAL | Status: DC | PRN

## 2023-03-15 MED ORDER — OXYCODONE-ACETAMINOPHEN 5-325 MG PO TABS: 1.0000 | ORAL_TABLET | Freq: Four times a day (QID) | ORAL | 0 refills | Status: AC | PRN

## 2023-03-15 MED ORDER — ACETAMINOPHEN 325 MG PO TABS: 325.0000 mg | ORAL_TABLET | ORAL | Status: DC | PRN

## 2023-03-15 MED ORDER — EPHEDRINE SULFATE-NACL 50-0.9 MG/10ML-% IV SOSY
PREFILLED_SYRINGE | INTRAVENOUS | Status: DC | PRN
  Administered 2023-03-15: 10 mg via INTRAVENOUS

## 2023-03-15 MED ORDER — FENTANYL CITRATE (PF) 100 MCG/2ML IJ SOLN
INTRAMUSCULAR | Status: DC | PRN
  Administered 2023-03-15: 50 ug via INTRAVENOUS

## 2023-03-15 MED ORDER — IBUPROFEN 800 MG PO TABS: 800.0000 mg | ORAL_TABLET | Freq: Three times a day (TID) | ORAL | 1 refills | Status: AC | PRN

## 2023-03-15 MED ORDER — IODINE STRONG (LUGOLS) 5 % PO SOLN
ORAL | Status: DC | PRN
  Administered 2023-03-15: .2 mL

## 2023-03-15 MED ORDER — OXYCODONE HCL 5 MG/5ML PO SOLN: 5.0000 mg | Freq: Once | ORAL | Status: DC | PRN

## 2023-03-15 MED ORDER — ACETAMINOPHEN 160 MG/5ML PO SOLN: 325.0000 mg | ORAL | Status: DC | PRN

## 2023-03-15 MED ORDER — ONDANSETRON HCL 4 MG/2ML IJ SOLN: 4.0000 mg | Freq: Once | INTRAMUSCULAR | Status: DC | PRN

## 2023-03-15 SURGICAL SUPPLY — 27 items
CLOTH BEACON ORANGE TIMEOUT ST (SAFETY) ×2 IMPLANT
COVER BACK TABLE 60X90IN (DRAPES) ×2 IMPLANT
DRAPE HYSTEROSCOPY (MISCELLANEOUS) ×2 IMPLANT
DRAPE SHEET LG 3/4 BI-LAMINATE (DRAPES) ×2 IMPLANT
ELECT BALL LEEP 3MM BLK (ELECTRODE) IMPLANT
ELECT BALL LEEP 5MM RED (ELECTRODE) IMPLANT
ELECT LOOP LEEP RND 10X10 YLW (CUTTING LOOP) ×2
ELECT LOOP LEEP RND 15X12 GRN (CUTTING LOOP)
ELECT LOOP LEEP RND 20X12 WHT (CUTTING LOOP) ×2
ELECTRODE LOOP LP RND 10X10YLW (CUTTING LOOP) IMPLANT
ELECTRODE LOOP LP RND 15X12GRN (CUTTING LOOP) IMPLANT
ELECTRODE LOOP LP RND 20X12WHT (CUTTING LOOP) IMPLANT
GAUZE 4X4 16PLY ~~LOC~~+RFID DBL (SPONGE) ×2 IMPLANT
GLOVE BIO SURGEON STRL SZ 6.5 (GLOVE) ×2 IMPLANT
KIT TURNOVER CYSTO (KITS) ×2 IMPLANT
LEGGING LITHOTOMY PAIR STRL (DRAPES) ×2 IMPLANT
MANIFOLD NEPTUNE II (INSTRUMENTS) IMPLANT
PACK BASIN DAY SURGERY FS (CUSTOM PROCEDURE TRAY) ×2 IMPLANT
PAD OB MATERNITY 4.3X12.25 (PERSONAL CARE ITEMS) ×2 IMPLANT
PAD PREP 24X48 CUFFED NSTRL (MISCELLANEOUS) ×2 IMPLANT
SCOPETTES 8 STERILE (MISCELLANEOUS) IMPLANT
SLEEVE SCD COMPRESS KNEE MED (STOCKING) ×2 IMPLANT
SUT VICRYL RAPIDE 3-0 36IN (SUTURE) IMPLANT
TOWEL OR 17X24 6PK STRL BLUE (TOWEL DISPOSABLE) ×2 IMPLANT
TRAY DSU PREP LF (CUSTOM PROCEDURE TRAY) ×2 IMPLANT
TUBE CONNECTING 12X1/4 (SUCTIONS) IMPLANT
YANKAUER SUCT BULB TIP NO VENT (SUCTIONS) IMPLANT

## 2023-03-15 NOTE — Op Note (Signed)
03/15/2023  5:57 PM  PATIENT:  Desiree Miller  32 y.o. female  PRE-OPERATIVE DIAGNOSIS:  Cervical Intraepithelial Neoplasia-Grade 3  POST-OPERATIVE DIAGNOSIS:  Cervical Intraepithelial Neoplasia-Grade 3  PROCEDURE:  Procedure(s): COLPOSCOPY (N/A) LOOP ELECTROSURGICAL EXCISION PROCEDURE (LEEP) (N/A)  SURGEON:  Surgeons and Role:    Noland Fordyce, MD - Primary  PHYSICIAN ASSISTANT:   ASSISTANTS: none   ANESTHESIA:   general  EBL:  30 mL   BLOOD ADMINISTERED:none  DRAINS: none   LOCAL MEDICATIONS USED:  OTHER 14 cc half percent Marcaine mixed with 1 200,000 units epinephrine  SPECIMEN:  Source of Specimen:  Cervical LEEP with Top-Hat  DISPOSITION OF SPECIMEN:  PATHOLOGY  COUNTS:  YES  TOURNIQUET:  * No tourniquets in log *  DICTATION: .Note written in EPIC  PLAN OF CARE: Discharge to home after PACU  PATIENT DISPOSITION:  PACU - hemodynamically stable.   Delay start of Pharmacological VTE agent (>24hrs) due to surgical blood loss or risk of bleeding: yes   LEEP PROCEDURE NOTE Pap smear and prior colposcopy reviewed.   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  Role of HPV,cervical dysplasia and need for close followup was empasized. Informed written consent was obtained. All questions were answered. Time out performed.  ??Procedure: The patient was placed in lithotomy position and the bivalved coated speculum was placed in the patient's vagina. A grounding pad placed on the patient. Acetic acid was applied to the cervix and the cervix and upper vagina were  completely evaluated with the use of the colposcope. Abnormal areas were noted and surgical mapping was carried out.  Of note patient had a large nonstaining area with Lugol's that extended below the cervix approaching the cervicovaginal junction from 4-7 o'clock.  A tenaculum was placed on the cervix to allow mobility of the cervix to better evaluate this junction.  Of note there was no availability of a insulated tenaculum for use during the procedure.  Surgical mapping was adjusted.  Local anesthesia was administered via an intracervical block using 14cc of 0.5% marcaine with epinephrine. The suction was turned on and the Large  Cone Biopsy Excisor on 30 Watts of cutting current was used to excise the area of decreased uptake.  A inferior pass was taken starting at the edge of the cervicovaginal junction.  This was a shallow pass.  A superior pass was then taken.  This was slightly deeper as it was incorporating only cervical tissue.  During this second pass the right lateral vaginal sidewall was encountered.  A very shallow and hemostatic defect was noted though this was closed with a 3-0 Vicryl in a figure-of-eight suture at the end of the procedure. a Top-Hat was then done in the cervical canal with the small cone loop.  An ECC was performed.    Excellent hemostasis was achieved using roller ball coagulation set at 30 Watts coagulation current. Monsel's solution was then applied and the speculum was removed from the vagina. Specimens were sent to pathology.  ?The patient tolerated the procedure well. Post-operative instructions given to patient and family, including instruction to seek medical attention for persistent bright red bleeding, fever, abdominal/pelvic pain, dysuria, nausea or vomiting. She was  also told about the possibility of having copious yellow to black tinged discharge. She was counseled to avoid anything in the vagina (sex/douching/tampons)until her follow-up office visit which will be  scheduled in 2 weeks.  Lendon Colonel 03/15/2023 5:58 PM

## 2023-03-15 NOTE — Progress Notes (Signed)
Spoke w/ via phone for pre-op interview--- pt Lab needs dos----  urine preg, no pre-op orders              Lab results------ no COVID test -----patient states asymptomatic no test needed Arrive at ------- 1215 on 03-15-2023 NPO after MN NO Solid Food.  Clear liquids from MN until--- 1115 Med rec completed Medications to take morning of surgery ----- none Diabetic medication ----- n/a Patient instructed no nail polish to be worn day of surgery Patient instructed to bring photo id and insurance card day of surgery Patient aware to have Driver (ride ) / caregiver    for 24 hours after surgery --husband, Desiree Miller Patient Special Instructions ----- n/a Pre-Op special Instructions ----- pat nurse had been unable to contact pt prior to [place] , today, I tried due to time change after receiving call from Dr Ernestina Penna office , [place], OR scheduler. No pre-op orders Patient verbalized understanding of instructions that were given at this phone interview. Patient denies shortness of breath, chest pain, fever, cough at this phone interview.

## 2023-03-15 NOTE — Discharge Instructions (Signed)

## 2023-03-15 NOTE — Transfer of Care (Signed)
Immediate Anesthesia Transfer of Care Note  Patient: Desiree Miller  Procedure(s) Performed: COLPOSCOPY (Vagina ) LOOP ELECTROSURGICAL EXCISION PROCEDURE (LEEP) (Cervix)  Patient Location: PACU  Anesthesia Type:General  Level of Consciousness: awake, alert , and oriented  Airway & Oxygen Therapy: Patient Spontanous Breathing  Post-op Assessment: Report given to RN and Post -op Vital signs reviewed and stable  Post vital signs: Reviewed and stable  Last Vitals:  Vitals Value Taken Time  BP 147/91   Temp    Pulse 98 03/15/23 1544  Resp 14    SpO2 100 % 03/15/23 1544  Vitals shown include unfiled device data.  Last Pain:  Vitals:   03/15/23 1256  TempSrc: Oral  PainSc: 0-No pain      Patients Stated Pain Goal: 4 (03/15/23 1256)  Complications: No notable events documented.

## 2023-03-15 NOTE — Brief Op Note (Signed)
03/15/2023  5:57 PM  PATIENT:  Desiree Miller  32 y.o. female  PRE-OPERATIVE DIAGNOSIS:  Cervical Intraepithelial Neoplasia-Grade 3  POST-OPERATIVE DIAGNOSIS:  Cervical Intraepithelial Neoplasia-Grade 3  PROCEDURE:  Procedure(s): COLPOSCOPY (N/A) LOOP ELECTROSURGICAL EXCISION PROCEDURE (LEEP) (N/A)  SURGEON:  Surgeons and Role:    Noland Fordyce, MD - Primary  PHYSICIAN ASSISTANT:   ASSISTANTS: none   ANESTHESIA:   general  EBL:  30 mL   BLOOD ADMINISTERED:none  DRAINS: none   LOCAL MEDICATIONS USED:  OTHER 20 cc 1% lidocaine mixed with 1 200,000 units epinephrine  SPECIMEN:  Source of Specimen:  Cervical LEEP with Top-Hat  DISPOSITION OF SPECIMEN:  PATHOLOGY  COUNTS:  YES  TOURNIQUET:  * No tourniquets in log *  DICTATION: .Note written in EPIC  PLAN OF CARE: Discharge to home after PACU  PATIENT DISPOSITION:  PACU - hemodynamically stable.   Delay start of Pharmacological VTE agent (>24hrs) due to surgical blood loss or risk of bleeding: yes

## 2023-03-15 NOTE — H&P (Signed)
Chief complaint: CN III  History of present illness: 32 year old G2 P0-1-1-2 here for LEEP procedure due to CIN-3 on a large portion of the cervix with endocervical margin involvement.  Patient had a normal Pap in 2021 and follow-up Pap in April 2024 showed high-grade intraepithelial neoplasia with high risk HPV.  She was negative for 16 and 1845.  A colposcopy in July 2024 revealed an impression of CIN-2 to 3 and pathology came back as CN III after long discussion of risks and benefits especially given patient's desire for probable repeat pregnancy decision was made to proceed with a LEEP.  Given her large cervical involvement and her syncope after office colposcopy decision was made to proceed with hospital-based surgery  Patient has a history of severe preeclampsia and a 33-week cesarean section for twin pregnancy.  Patient is currently contraception with birth control pills.  Past Medical History:  Diagnosis Date   Anemia    CIN III (cervical intraepithelial neoplasia III) 02/2023   gyn-- dr Raye Sorrow   GAD (generalized anxiety disorder)    GERD (gastroesophageal reflux disease)    Heart murmur    03-15-2023  per pt dx as adult, approx >7 yrs ago, asymptomatic, no work-up   History of pregnancy induced hypertension 10/06/2020   preeclampsia, third trmester,  twins pregnancy    Past Surgical History:  Procedure Laterality Date   CESAREAN SECTION MULTI-GESTATIONAL N/A 10/05/2020   Procedure: CESAREAN SECTION MULTI-GESTATIONAL;  Surgeon: Shea Evans, MD;  Location: [facility];  Service: Obstetrics;  Laterality: N/A;  Malpresentation Di-Di twins   COLONOSCOPY  2010   WISDOM TOOTH EXTRACTION  2009   PE:   There were no vitals filed for this visit. Gen: slightly anxious, no distress Skin: warm/ dry LE: NT, no edema GU: def to OR  UPT neg  Cbc pending.  A/P: CIN 3, plan LEEP. R/B reviewed with pt/  Lendon Colonel 03/15/2023 1:01 PM

## 2023-03-15 NOTE — Anesthesia Preprocedure Evaluation (Addendum)
Anesthesia Evaluation  Patient identified by MRN, date of birth, ID band Patient awake    Reviewed: Allergy & Precautions, H&P , NPO status , Patient's Chart, lab work & pertinent test results  Airway Mallampati: I  TM Distance: >3 FB Neck ROM: Full    Dental no notable dental hx. (+) Teeth Intact, Dental Advisory Given   Pulmonary neg pulmonary ROS   Pulmonary exam normal breath sounds clear to auscultation       Cardiovascular Exercise Tolerance: Good (-) hypertensionNormal cardiovascular exam Rhythm:Regular Rate:Normal     Neuro/Psych   Anxiety     negative neurological ROS  negative psych ROS   GI/Hepatic Neg liver ROS,GERD  Medicated and Controlled,,  Endo/Other  negative endocrine ROS    Renal/GU negative Renal ROS  negative genitourinary   Musculoskeletal negative musculoskeletal ROS (+)    Abdominal   Peds negative pediatric ROS (+)  Hematology  (+) Blood dyscrasia, anemia   Anesthesia Other Findings   Reproductive/Obstetrics                             Anesthesia Physical Anesthesia Plan  ASA: 2  Anesthesia Plan: General   Post-op Pain Management: Ofirmev IV (intra-op)*   Induction: Intravenous  PONV Risk Score and Plan: 2 and Treatment may vary due to age or medical condition and Ondansetron  Airway Management Planned: LMA  Additional Equipment:   Intra-op Plan:   Post-operative Plan: Extubation in OR  Informed Consent: I have reviewed the patients History and Physical, chart, labs and discussed the procedure including the risks, benefits and alternatives for the proposed anesthesia with the patient or authorized representative who has indicated his/her understanding and acceptance.       Plan Discussed with: CRNA, Anesthesiologist and Surgeon  Anesthesia Plan Comments:         Anesthesia Quick Evaluation

## 2023-03-15 NOTE — Anesthesia Procedure Notes (Addendum)
Procedure Name: LMA Insertion Date/Time: 03/15/2023 2:46 PM  Performed by: Briant Sites, CRNAPre-anesthesia Checklist: Patient identified, Emergency Drugs available, Suction available and Patient being monitored Patient Re-evaluated:Patient Re-evaluated prior to induction Oxygen Delivery Method: Circle system utilized Preoxygenation: Pre-oxygenation with 100% oxygen Induction Type: IV induction Ventilation: Mask ventilation without difficulty LMA: LMA inserted LMA Size: 4.0 Number of attempts: 1 Airway Equipment and Method: Bite block Placement Confirmation: positive ETCO2 Tube secured with: Tape Dental Injury: Teeth and Oropharynx as per pre-operative assessment

## 2023-03-16 ENCOUNTER — Encounter (HOSPITAL_BASED_OUTPATIENT_CLINIC_OR_DEPARTMENT_OTHER): Payer: Self-pay | Admitting: Obstetrics

## 2023-03-16 NOTE — Anesthesia Postprocedure Evaluation (Signed)
Anesthesia Post Note  Patient: Desiree Miller  Procedure(s) Performed: COLPOSCOPY (Vagina ) LOOP ELECTROSURGICAL EXCISION PROCEDURE (LEEP) (Cervix)     Patient location during evaluation: PACU Anesthesia Type: General Level of consciousness: awake and alert Pain management: pain level controlled Vital Signs Assessment: post-procedure vital signs reviewed and stable Respiratory status: spontaneous breathing, nonlabored ventilation, respiratory function stable and patient connected to nasal cannula oxygen Cardiovascular status: blood pressure returned to baseline and stable Postop Assessment: no apparent nausea or vomiting Anesthetic complications: no   No notable events documented.  Last Vitals:  Vitals:   03/15/23 1600 03/15/23 1615  BP: 137/89 (!) 142/77  Pulse: 84 87  Resp: 15 11  Temp:    SpO2: 100% 98%    Last Pain:  Vitals:   03/15/23 1630  TempSrc:   PainSc: 3                   P 

## 2023-04-07 DIAGNOSIS — R7989 Other specified abnormal findings of blood chemistry: Secondary | ICD-10-CM | POA: Diagnosis not present

## 2023-04-14 DIAGNOSIS — R41840 Attention and concentration deficit: Secondary | ICD-10-CM | POA: Diagnosis not present

## 2023-04-14 DIAGNOSIS — Z1339 Encounter for screening examination for other mental health and behavioral disorders: Secondary | ICD-10-CM | POA: Diagnosis not present

## 2023-04-14 DIAGNOSIS — Z1331 Encounter for screening for depression: Secondary | ICD-10-CM | POA: Diagnosis not present

## 2023-04-14 DIAGNOSIS — Z0000 Encounter for general adult medical examination without abnormal findings: Secondary | ICD-10-CM | POA: Diagnosis not present

## 2023-08-11 DIAGNOSIS — R058 Other specified cough: Secondary | ICD-10-CM | POA: Diagnosis not present

## 2023-09-15 DIAGNOSIS — J351 Hypertrophy of tonsils: Secondary | ICD-10-CM | POA: Diagnosis not present

## 2023-09-15 DIAGNOSIS — R051 Acute cough: Secondary | ICD-10-CM | POA: Diagnosis not present

## 2023-09-15 DIAGNOSIS — J069 Acute upper respiratory infection, unspecified: Secondary | ICD-10-CM | POA: Diagnosis not present

## 2023-10-19 DIAGNOSIS — N870 Mild cervical dysplasia: Secondary | ICD-10-CM | POA: Diagnosis not present

## 2023-10-19 DIAGNOSIS — Z3202 Encounter for pregnancy test, result negative: Secondary | ICD-10-CM | POA: Diagnosis not present

## 2023-10-19 DIAGNOSIS — D069 Carcinoma in situ of cervix, unspecified: Secondary | ICD-10-CM | POA: Diagnosis not present

## 2024-04-10 DIAGNOSIS — D069 Carcinoma in situ of cervix, unspecified: Secondary | ICD-10-CM | POA: Diagnosis not present

## 2024-04-10 DIAGNOSIS — R87810 Cervical high risk human papillomavirus (HPV) DNA test positive: Secondary | ICD-10-CM | POA: Diagnosis not present

## 2024-04-10 DIAGNOSIS — D067 Carcinoma in situ of other parts of cervix: Secondary | ICD-10-CM | POA: Diagnosis not present

## 2024-04-10 DIAGNOSIS — Z803 Family history of malignant neoplasm of breast: Secondary | ICD-10-CM | POA: Diagnosis not present

## 2024-04-10 DIAGNOSIS — Z3202 Encounter for pregnancy test, result negative: Secondary | ICD-10-CM | POA: Diagnosis not present

## 2024-04-10 DIAGNOSIS — N870 Mild cervical dysplasia: Secondary | ICD-10-CM | POA: Diagnosis not present

## 2024-04-10 DIAGNOSIS — N72 Inflammatory disease of cervix uteri: Secondary | ICD-10-CM | POA: Diagnosis not present

## 2024-04-10 DIAGNOSIS — Z853 Personal history of malignant neoplasm of breast: Secondary | ICD-10-CM | POA: Diagnosis not present

## 2024-04-12 DIAGNOSIS — R7989 Other specified abnormal findings of blood chemistry: Secondary | ICD-10-CM | POA: Diagnosis not present

## 2024-04-12 DIAGNOSIS — Z0189 Encounter for other specified special examinations: Secondary | ICD-10-CM | POA: Diagnosis not present

## 2024-04-19 DIAGNOSIS — Z1331 Encounter for screening for depression: Secondary | ICD-10-CM | POA: Diagnosis not present

## 2024-04-19 DIAGNOSIS — G4700 Insomnia, unspecified: Secondary | ICD-10-CM | POA: Diagnosis not present

## 2024-04-19 DIAGNOSIS — Z0000 Encounter for general adult medical examination without abnormal findings: Secondary | ICD-10-CM | POA: Diagnosis not present

## 2024-04-19 DIAGNOSIS — Z1339 Encounter for screening examination for other mental health and behavioral disorders: Secondary | ICD-10-CM | POA: Diagnosis not present

## 2024-04-27 DIAGNOSIS — Z1331 Encounter for screening for depression: Secondary | ICD-10-CM | POA: Diagnosis not present

## 2024-04-27 DIAGNOSIS — Z01419 Encounter for gynecological examination (general) (routine) without abnormal findings: Secondary | ICD-10-CM | POA: Diagnosis not present

## 2024-06-05 ENCOUNTER — Encounter: Payer: Self-pay | Admitting: Radiology

## 2024-06-15 ENCOUNTER — Other Ambulatory Visit: Payer: Self-pay

## 2024-06-15 ENCOUNTER — Ambulatory Visit: Admitting: Physician Assistant

## 2024-06-15 DIAGNOSIS — M542 Cervicalgia: Secondary | ICD-10-CM | POA: Diagnosis not present

## 2024-06-15 DIAGNOSIS — M546 Pain in thoracic spine: Secondary | ICD-10-CM

## 2024-06-15 DIAGNOSIS — S29012A Strain of muscle and tendon of back wall of thorax, initial encounter: Secondary | ICD-10-CM | POA: Diagnosis not present

## 2024-06-15 NOTE — Progress Notes (Unsigned)
 Office Visit Note   Patient: Desiree Miller           Date of Birth: June 17, 1991           MRN: [id number] Visit Date: 06/15/2024              Requested by: Larnell Hamilton, MD [address] [place],  [place] [address] PCP: Larnell Hamilton, MD  No chief complaint on file.     HPI: 33 y/o female with neck pain started 3 months ago and then she had an episode of right > left tingling pain into the right elbow down to the right 4 th and 5th digits.  She felt like pulled muscles in the upper back around the shoulder blades.    She is normally active and lifts weights.  She has not lifted for a month to try and rest the area.  No history of trauma.    Assessment & Plan: Visit Diagnoses: No diagnosis found.  Plan: ***  Follow-Up Instructions: No follow-ups on file.   Ortho Exam  Patient is alert, oriented, no adenopathy, well-dressed, normal affect, normal respiratory effort. ***    Imaging: No results found. No images are attached to the encounter.  Labs: Lab Results  Component Value Date   ESRSEDRATE 10 03/19/2009     Lab Results  Component Value Date   ALBUMIN 4.0 03/01/2021   ALBUMIN 2.3 (L) 10/09/2020   ALBUMIN 1.9 (L) 10/07/2020    No results found for: MG No results found for: VD25OH  No results found for: PREALBUMIN    Latest Ref Rng & Units 03/01/2021    5:19 AM 10/07/2020    7:48 PM 10/07/2020    4:55 AM  CBC EXTENDED  WBC 4.0 - 10.5 K/uL 7.5  16.2  13.8   RBC 3.87 - 5.11 MIL/uL 4.08  2.72  1.85   Hemoglobin 12.0 - 15.0 g/dL 86.7  9.0  6.2   HCT 63.9 - 46.0 % 37.5  26.6  18.6   Platelets 150 - 400 K/uL 271  179  135      There is no height or weight on file to calculate BMI.  Orders:  No orders of the defined types were placed in this encounter.  No orders of the defined types were placed in this encounter.    Procedures: No procedures performed  Clinical Data: No additional findings.  ROS:  All other systems negative, except as  noted in the HPI. Review of Systems  Objective: Vital Signs: There were no vitals taken for this visit.  Specialty Comments:  No specialty comments available.  PMFS History: Patient Active Problem List   Diagnosis Date Noted   Anxiety disorder 10/08/2020   Status post primary low transverse cesarean section 3/5 10/07/2020   Postpartum care following cesarean delivery 3/5 10/07/2020   Postpartum hemorrhage - 1874cc 10/07/2020   Anemia associated with acute blood loss 10/07/2020   Preterm labor in third trimester with preterm delivery 10/06/2020   Preeclampsia, severe, third trimester 10/06/2020   Twin pregnancy, twins dichorionic and diamniotic 10/06/2020   Past Medical History:  Diagnosis Date   Anemia    CIN III (cervical intraepithelial neoplasia III) 02/2023   gyn-- dr scarlette   GAD (generalized anxiety disorder)    GERD (gastroesophageal reflux disease)    Heart murmur    03-15-2023  per pt dx as adult, approx >7 yrs ago, asymptomatic, no work-up   History of pregnancy induced hypertension 10/06/2020   preeclampsia,  third trmester,  twins pregnancy    No family history on file.  Past Surgical History:  Procedure Laterality Date   CESAREAN SECTION MULTI-GESTATIONAL N/A 10/05/2020   Procedure: CESAREAN SECTION MULTI-GESTATIONAL;  Surgeon: Barbette Knock, MD;  Location: [facility];  Service: Obstetrics;  Laterality: N/A;  Malpresentation Di-Di twins   COLONOSCOPY  2010   COLPOSCOPY N/A 03/15/2023   Procedure: COLPOSCOPY;  Surgeon: Kandyce Sor, MD;  Location: [facility];  Service: Gynecology;  Laterality: N/A;   LEEP N/A 03/15/2023   Procedure: LOOP ELECTROSURGICAL EXCISION PROCEDURE (LEEP);  Surgeon: Kandyce Sor, MD;  Location: [facility];  Service: Gynecology;  Laterality: N/A;   WISDOM TOOTH EXTRACTION  2009   Social History   Occupational History   Not on file  Tobacco Use   Smoking status: Never   Smokeless tobacco: Never   Vaping Use   Vaping status: Never Used  Substance and Sexual Activity   Alcohol  use: Yes    Alcohol /week: 7.0 standard drinks of alcohol     Types: 7 Glasses of wine per week   Drug use: Never   Sexual activity: Not on file

## 2024-06-16 ENCOUNTER — Encounter: Payer: Self-pay | Admitting: Physician Assistant
